# Patient Record
Sex: Male | Born: 1990 | Race: White | Hispanic: No | Marital: Single | Smoking: Current every day smoker
Health system: Southern US, Community
[De-identification: ages and names within clinical notes are randomized; demographics above are authoritative.]

## PROBLEM LIST (undated history)

## (undated) DIAGNOSIS — F32A Depression, unspecified: Secondary | ICD-10-CM

## (undated) DIAGNOSIS — F909 Attention-deficit hyperactivity disorder, unspecified type: Secondary | ICD-10-CM

## (undated) DIAGNOSIS — F429 Obsessive-compulsive disorder, unspecified: Secondary | ICD-10-CM

## (undated) DIAGNOSIS — F329 Major depressive disorder, single episode, unspecified: Secondary | ICD-10-CM

## (undated) HISTORY — DX: Attention-deficit hyperactivity disorder, unspecified type: F90.9

## (undated) HISTORY — DX: Major depressive disorder, single episode, unspecified: F32.9

## (undated) HISTORY — DX: Depression, unspecified: F32.A

## (undated) HISTORY — DX: Obsessive-compulsive disorder, unspecified: F42.9

---

## 2009-02-11 ENCOUNTER — Inpatient Hospital Stay (HOSPITAL_COMMUNITY): Admission: EM | Admit: 2009-02-11 | Discharge: 2009-02-13 | Payer: Self-pay | Admitting: Emergency Medicine

## 2009-02-11 ENCOUNTER — Emergency Department: Payer: Self-pay | Admitting: Emergency Medicine

## 2009-02-16 ENCOUNTER — Ambulatory Visit: Payer: Self-pay | Admitting: Family Medicine

## 2009-06-13 ENCOUNTER — Ambulatory Visit: Payer: Self-pay | Admitting: Family Medicine

## 2009-06-13 DIAGNOSIS — F988 Other specified behavioral and emotional disorders with onset usually occurring in childhood and adolescence: Secondary | ICD-10-CM | POA: Insufficient documentation

## 2009-06-15 ENCOUNTER — Encounter: Payer: Self-pay | Admitting: Family Medicine

## 2009-06-28 ENCOUNTER — Telehealth: Payer: Self-pay | Admitting: Family Medicine

## 2009-06-30 ENCOUNTER — Ambulatory Visit: Payer: Self-pay | Admitting: Family Medicine

## 2009-08-02 ENCOUNTER — Ambulatory Visit: Payer: Self-pay | Admitting: Family Medicine

## 2009-10-05 ENCOUNTER — Telehealth: Payer: Self-pay | Admitting: Family Medicine

## 2009-11-03 ENCOUNTER — Telehealth: Payer: Self-pay | Admitting: Family Medicine

## 2009-12-04 ENCOUNTER — Telehealth: Payer: Self-pay | Admitting: Family Medicine

## 2010-01-03 ENCOUNTER — Telehealth: Payer: Self-pay | Admitting: Family Medicine

## 2010-01-27 ENCOUNTER — Ambulatory Visit: Payer: Self-pay | Admitting: Family Medicine

## 2010-01-27 DIAGNOSIS — IMO0002 Reserved for concepts with insufficient information to code with codable children: Secondary | ICD-10-CM | POA: Insufficient documentation

## 2010-01-29 ENCOUNTER — Telehealth (INDEPENDENT_AMBULATORY_CARE_PROVIDER_SITE_OTHER): Payer: Self-pay | Admitting: *Deleted

## 2010-03-01 ENCOUNTER — Telehealth: Payer: Self-pay | Admitting: Family Medicine

## 2010-04-12 ENCOUNTER — Ambulatory Visit
Admission: RE | Admit: 2010-04-12 | Discharge: 2010-04-12 | Payer: Self-pay | Source: Home / Self Care | Attending: Family Medicine | Admitting: Family Medicine

## 2010-04-17 NOTE — Letter (Signed)
Summary: Records Dated 07-11-08 thru 11-10-08/[facility]   Records Dated 07-11-08 thru 11-10-08/[facility]   Imported By: Lanelle Bal 06/22/2009 10:32:34  _____________________________________________________________________  External Attachment:    Type:   Image     Comment:   External Document

## 2010-04-17 NOTE — Progress Notes (Signed)
Summary: ? OV  Phone Note Call from Patient   Caller: Mom Summary of Call: Mother called stating Pt needed OV w/ EKG bc you told him at last OV that he had an irregular heartbeat. I looked back through note and PE but everything is documented as normal. Please advise.  Initial call taken by: Payton Spark CMA,  June 28, 2009 11:35 AM  Follow-up for Phone Call        RTC for a nurse visit in 1 month for BP/ EKG  this was the statement at last OV. Follow-up by: Seymour Bars DO,  June 28, 2009 11:54 AM     Appended Document: ? OV Scheduled

## 2010-04-17 NOTE — Progress Notes (Signed)
Summary: Rx  Phone Note Call from Patient   Caller: Patient Summary of Call: Dr.Gaylyn Berish    Call Back [phone]  Patient need a Rx for Vyvanse 70mg  Initial call taken by: Vanessa Swazil[place],  December 04, 2009 10:07 AM    Prescriptions: VYVANSE 70 MG CAPS (LISDEXAMFETAMINE DIMESYLATE) 1 capsule by mouth q AM  #30 x 0   Entered and Authorized by:   Seymour Bars DO   Signed by:   Seymour Bars DO on 12/04/2009   Method used:   Print then Give to Patient   RxID:   [id number]

## 2010-04-17 NOTE — Miscellaneous (Signed)
Summary: old records  Clinical Lists Changes  Observations: Added new observation of PAST MED HX: ADHD hx of depression-- was on sertraline hx of OCD smoker  Pneumovax 2010 Tdap 2005 (06/15/2009 16:47) Added new observation of PRIMARY MD: Seymour Bars DO (06/15/2009 16:47)       Past History:  Past Medical History: ADHD hx of depression-- was on sertraline hx of OCD smoker  Pneumovax 2010 Tdap 2005

## 2010-04-17 NOTE — Assessment & Plan Note (Signed)
Summary: sinusitis   Vital Signs:  Patient profile:   20 year old male Height:      72.5 inches Weight:      170 pounds BMI:     22.82 O2 Sat:      97 % on Room air Temp:     98.7 degrees F oral Pulse rate:   105 / minute BP sitting:   124 / 83  (left arm) Cuff size:   large  Vitals Entered By: Payton Spark CMA (Aug 02, 2009 2:06 PM)  O2 Flow:  Room air CC: Facial pressure, nasal congestion, fever, cough and achy x 1 week.    Primary Care Provider:  Seymour Bars DO  CC:  Facial pressure, nasal congestion, fever, and cough and achy x 1 week. Marland Kitchen  History of Present Illness: 20 yo WM present for 1 wk of head congestion.  He has had a fever up 102 the last 2 days.  He feels really tired.  no sore throat.  he is coughing.  He has tried Mucinex, [employer], Nyquil, ASA.  Meds don't help much.  No hx of allergies.  Drinking well but not eating.  No N/V/D.  No appetitie.  Pressure/ HA behind L eye.  Current Medications (verified): 1)  Vyvanse 70 Mg Caps (Lisdexamfetamine Dimesylate) .Marland Kitchen.. 1 Capsule By Mouth Q Am  Allergies (verified): 1)  ! Cephalexin  Past History:  Past Medical History: Reviewed history from 06/15/2009 and no changes required. ADHD hx of depression-- was on sertraline hx of OCD smoker  Pneumovax 2010 Tdap 2005  Past Surgical History: Reviewed history from 02/16/2009 and no changes required. Denies surgical history  Social History: Reviewed history from 06/13/2009 and no changes required. Occupation:student Single Current Smoker 1ppd x 4 yrs Alcohol use-no Sexually active. In school for chemistry.  Review of Systems      See HPI  Physical Exam  General:  alert, well-developed, well-nourished, and well-hydrated.   Head:  normocephalic and atraumatic.  L frontal and maxillary sinus TTP Eyes:  eyes slightly watery, slight ptosis L eye Ears:  EACs patent; TMs translucent and gray with good cone of light and bony landmarks.  Nose:  copious nasal  congestion Mouth:  o/p injected with clear postnasal drip Neck:  no masses.   Lungs:  Normal respiratory effort, chest expands symmetrically. Lungs are clear to auscultation, no crackles or wheezes. dry cough Heart:  Normal rate and regular rhythm. S1 and S2 normal without gallop, murmur, click, rub or other extra sounds. Skin:  color normal.   Cervical Nodes:  No lymphadenopathy noted   Impression & Recommendations:  Problem # 1:  ACUTE SINUSITIS, UNSPECIFIED (ICD-461.9) Treat with 10 days of Amoxicillin + Advil cold and sinus. Call if not improtiving in1 wk.  Avoid smoking. His updated medication list for this problem includes:    Amoxicillin 875 Mg Tabs (Amoxicillin) .Marland Kitchen... 1 tab by mouth two times a day x 10 days  Complete Medication List: 1)  Vyvanse 70 Mg Caps (Lisdexamfetamine dimesylate) .Marland Kitchen.. 1 capsule by mouth q am 2)  Amoxicillin 875 Mg Tabs (Amoxicillin) .Marland Kitchen.. 1 tab by mouth two times a day x 10 days  Patient Instructions: 1)  Take Amoxicillin x 10 days. 2)  Use OTC Advil cold and sinus + Delsym for congestion + cough. 3)  Avoid smoking. 4)  Call if  not improving in 1 wk. Prescriptions: AMOXICILLIN 875 MG TABS (AMOXICILLIN) 1 tab by mouth two times a day x 10 days  #  20 x 0   Entered and Authorized by:   Seymour Bars DO   Signed by:   Seymour Bars DO on 08/02/2009   Method used:   Print then Give to Patient   RxID:   [id number] VYVANSE 70 MG CAPS (LISDEXAMFETAMINE DIMESYLATE) 1 capsule by mouth q AM  #30 x 0   Entered and Authorized by:   Seymour Bars DO   Signed by:   Seymour Bars DO on 08/02/2009   Method used:   Print then Give to Patient   RxID:   [id number]

## 2010-04-17 NOTE — Assessment & Plan Note (Signed)
Summary: BP check and EKG  Nurse Visit   Vital Signs:  Patient profile:   20 year old male Height:      72.5 inches Pulse rate:   77 / minute BP sitting:   102 / 58  (left arm) Cuff size:   regular  Vitals Entered By: Kathlene November (June 30, 2009 11:23 AM) CC: Follow-up HTN HPI: Taking Meds?not taking any Side Effects?none Chest Pain, SOB, Dizziness?none A/P: HTN(401.1) At Goal? If no, MD will be notified. Follow-up in--  5 minutes was spent with the pt.  Allergies: 1)  ! Cephalexin  Orders Added: 1)  Est. Patient Level I [56213] 2)  EKG w/ Interpretation [93000] Prescriptions: VYVANSE 70 MG CAPS (LISDEXAMFETAMINE DIMESYLATE) 1 capsule by mouth q AM  #30 x 0   Entered and Authorized by:   Seymour Bars DO   Signed by:   Seymour Bars DO on 06/30/2009   Method used:   Print then Give to Patient   RxID:   [id number]      Impression & Recommendations:  Problem # 1:  ADD (ICD-314.00)  Nurse visit BP check today -- BP looks great on Vyvanse this time. EKG NSR, normal QTC.  No arrythmia or sign of ischemia.  Normal axis. RFd Vyvanse.    Orders: EKG w/ Interpretation (93000)  Complete Medication List: 1)  Vyvanse 70 Mg Caps (Lisdexamfetamine dimesylate) .Marland Kitchen.. 1 capsule by mouth q am

## 2010-04-17 NOTE — Assessment & Plan Note (Signed)
Summary: NOV ADHD   Vital Signs:  Patient profile:   20 year old male Height:      72.5 inches Weight:      174 pounds BMI:     23.36 O2 Sat:      99 % on Room air Temp:     98.3 degrees F oral Pulse rate:   75 / minute BP sitting:   148 / 76  (left arm) Cuff size:   regular  Vitals Entered By: Payton Spark CMA (June 13, 2009 1:47 PM)  O2 Flow:  Room air CC: New to est. Discuss ADHD meds and refills.    Primary Care Provider:  Seymour Bars DO  CC:  New to est. Discuss ADHD meds and refills. .  History of Present Illness: 20 yo WM presents for NOV.  He has a hx of ADHD.  He was formerly seen a [facility] but his mom lost his RX for Adderalll and he was told that they would no longer fill his RX.  He has been on meds for 14 yrs. He was on Vyvanse 70 mg/ day and did well on this.  He changed to Adderall 20 mg two times a day but doesn't feel like this helped much.  He is in school and needs to focus on his schoolwork.  He smoked a cigarette just before coming in today.    Current Medications (verified): 1)  Adderall 20 Mg Tabs (Amphetamine-Dextroamphetamine) .... Take 1 Tab By Mouth Two Times A Day  Allergies (verified): 1)  ! Cephalexin  Past History:  Past Medical History: Reviewed history from 02/16/2009 and no changes required. ADHD  Past Surgical History: Reviewed history from 02/16/2009 and no changes required. Denies surgical history  Family History: brother high cholesterol father ETOHism, HTN, high cholesterol mother DM  Social History: Reviewed history from 02/16/2009 and no changes required. Occupation:student Single Current Smoker 1ppd x 4 yrs Alcohol use-no Sexually active. In school for chemistry.  Review of Systems       no fevers/sweats/weakness, unexplained wt loss/gain, no change in vision, no difficulty hearing, ringing in ears, no hay fever/allergies, no CP/discomfort, no palpitations, no breast lump/nipple discharge, no cough/wheeze, no  blood in stool, no N/V/D, no nocturia, no leaking urine, no unusual vag bleeding, no vaginal/penile discharge, no muscle/joint pain, no rash, no new/changing mole, no HA, no memory loss, no anxiety, no sleep problem, no depression, no unexplained lumps, no easy bruising/bleeding, no concern with sexual function   Physical Exam  General:  alert, well-developed, well-nourished, and well-hydrated.   Head:  normocephalic and atraumatic.   Nose:  no nasal discharge.   Mouth:  good dentition and pharynx pink and moist.   Neck:  no masses.   Lungs:  Normal respiratory effort, chest expands symmetrically. Lungs are clear to auscultation, no crackles or wheezes. Heart:  Normal rate and regular rhythm. S1 and S2 normal without gallop, murmur, click, rub or other extra sounds. Skin:  color normal.   Cervical Nodes:  No lymphadenopathy noted Psych:  good eye contact, not anxious appearing, and not depressed appearing.     Impression & Recommendations:  Problem # 1:  ADD (ICD-314.00) Will transition him back to Vyvanse 70 mg qAM.  RTC for a nurse visit in 1 month for BP/ EKG. Will watch his BP since it is elevated today.    Complete Medication List: 1)  Vyvanse 70 Mg Caps (Lisdexamfetamine dimesylate) .Marland Kitchen.. 1 capsule by mouth q am  Patient Instructions: 1)  Change ADHD meds to Vyvanse daily. 2)  Return for a nurse BP check/ EKG in 1 month. 3)  Schedule a PHYSICAL in June. 4)  Plan on starting PT down the hall for hx of low back fracture. 5)  You will get a call to get started.   Prescriptions: VYVANSE 70 MG CAPS (LISDEXAMFETAMINE DIMESYLATE) 1 capsule by mouth q AM  #30 x 0   Entered and Authorized by:   Seymour Bars DO   Signed by:   Seymour Bars DO on 06/13/2009   Method used:   Print then Give to Patient   RxID:   [id number]

## 2010-04-17 NOTE — Progress Notes (Signed)
Summary: Vyvance refill  Phone Note Refill Request   Refills Requested: Medication #1:  VYVANSE 70 MG CAPS 1 capsule by mouth q AM Initial call taken by: Payton Spark CMA,  October 05, 2009 1:46 PM    Prescriptions: VYVANSE 70 MG CAPS (LISDEXAMFETAMINE DIMESYLATE) 1 capsule by mouth q AM  #30 x 0   Entered and Authorized by:   Seymour Bars DO   Signed by:   Seymour Bars DO on 10/06/2009   Method used:   Print then Give to Patient   RxID:   [id number]

## 2010-04-17 NOTE — Letter (Signed)
Summary: Out of Work  [facility]  [address]   [place], [place] [address]   Phone: [phone]  Fax: [phone]    January 27, 2010   Employee:  TEE RICHESON    To Whom It May Concern:   Parish was evaluated in our clinic today, and left at 3:35PM   If you need additional information, please feel free to contact our office.         Sincerely,    Donna Christen MD
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
## 2010-04-17 NOTE — Progress Notes (Signed)
Summary: Vyvance refill  Phone Note Refill Request   Refills Requested: Medication #1:  VYVANSE 70 MG CAPS 1 capsule by mouth q AM Initial call taken by: Payton Spark CMA,  November 03, 2009 9:47 AM    Prescriptions: VYVANSE 70 MG CAPS (LISDEXAMFETAMINE DIMESYLATE) 1 capsule by mouth q AM  #30 x 0   Entered and Authorized by:   Seymour Bars DO   Signed by:   Seymour Bars DO on 11/03/2009   Method used:   Print then Give to Patient   RxID:   [id number]

## 2010-04-17 NOTE — Progress Notes (Signed)
Summary: Rx. refill request  Phone Note Refill Request Message from:  Patient on January 03, 2010 8:35 AM  Refills Requested: Medication #1:  VYVANSE 70 MG CAPS 1 capsule by mouth q AM Mom is requesting that she can come by and pick this up this morning... Wants to be called when its ready [phone]  Initial call taken by: Michaelle Copas,  January 03, 2010 8:36 AM    Prescriptions: VYVANSE 70 MG CAPS (LISDEXAMFETAMINE DIMESYLATE) 1 capsule by mouth q AM  #30 x 0   Entered and Authorized by:   Seymour Bars DO   Signed by:   Seymour Bars DO on 01/03/2010   Method used:   Print then Give to Patient   RxID:   [id number]

## 2010-04-17 NOTE — Miscellaneous (Signed)
Summary: Vaccine Records/[facility]  Vaccine Records/[facility]   Imported By: Lanelle Bal 06/22/2009 10:33:42  _____________________________________________________________________  External Attachment:    Type:   Image     Comment:   External Document

## 2010-04-17 NOTE — Progress Notes (Signed)
  Phone Note Outgoing Call Call back at [facility] Phone [phone]   Call placed by: Lajean Saver RN,  January 29, 2010 10:01 AM Call placed to: Patient Action Taken: Phone Call Completed Summary of Call: Caqllback: Spoke with patient's parent who says Forbes seems to be doing better. Message left for him to call with any questions or concerns

## 2010-04-19 NOTE — Assessment & Plan Note (Signed)
Summary: ADD f/u   Vital Signs:  Patient profile:   20 year old male Height:      72.5 inches Weight:      174 pounds BMI:     23.36 O2 Sat:      97 % on Room air Pulse rate:   84 / minute BP sitting:   130 / 76  (left arm) Cuff size:   regular  Vitals Entered By: Payton Spark CMA (April 12, 2010 10:47 AM)  O2 Flow:  Room air CC: F/U ADD. Discuss med change   Primary Care Provider:  Seymour Bars DO  CC:  F/U ADD. Discuss med change.  History of Present Illness: 20 yo WM presents for f/u ADD.  He is doing well on Vyvanse 70 mg once daily M-->F.  He feels like it is only working for about 4 hrs and then he feels like it's not working anymore.  Denies insomnia or palpitations.  It has curbed his appetite some.  He has been on 'everything' for his ADD in the past which seem to last about 6 mos then wear off.  He denies any feelings of depression or anxiety.      Current Medications (verified): 1)  Vyvanse 70 Mg Caps (Lisdexamfetamine Dimesylate) .Marland Kitchen.. 1 Capsule By Mouth Q Am  Allergies (verified): 1)  ! Cephalexin  Past History:  Past Medical History: Reviewed history from 06/15/2009 and no changes required. ADHD hx of depression-- was on sertraline hx of OCD smoker  Pneumovax 2010 Tdap 2005  Social History: Reviewed history from 06/13/2009 and no changes required. Occupation:student Single Current Smoker 1ppd x 4 yrs Alcohol use-no Sexually active. In school for chemistry.  Review of Systems      See HPI  Physical Exam  General:  alert, well-developed, well-nourished, and well-hydrated.   Neck:  no masses.   Lungs:  Normal respiratory effort, chest expands symmetrically. Lungs are clear to auscultation, no crackles or wheezes. Heart:  Normal rate and regular rhythm. S1 and S2 normal without gallop, murmur, click, rub or other extra sounds. Psych:  good eye contact, not anxious appearing, and not depressed appearing.     Impression &  Recommendations:  Problem # 1:  ADD (ICD-314.00) Feels like Vyvanse is not working anymore and has hx of ADD meds 'wearing off' after 6 mos of use.  Will change him back to Adderall short acting 20 mg two times a day which worked well for him in the past.  Call if any problems. RTC in 6 mos. BP/ HR normal.  Complete Medication List: 1)  Amphetamine-dextroamphetamine 20 Mg Tabs (Amphetamine-dextroamphetamine) .Marland Kitchen.. 1 tab by mouth qam and at 2 pm  Patient Instructions: 1)  f/u ADD in 6 mos, sooner if needed Prescriptions: AMPHETAMINE-DEXTROAMPHETAMINE 20 MG TABS (AMPHETAMINE-DEXTROAMPHETAMINE) 1 tab by mouth qAM and at 2 pm  #60 x 0   Entered and Authorized by:   Seymour Bars DO   Signed by:   Seymour Bars DO on 04/12/2010   Method used:   Print then Give to Patient   RxID:   [id number]    Orders Added: 1)  Est. Patient Level III [13086]

## 2010-04-19 NOTE — Progress Notes (Signed)
Summary: Vyvance refill  Phone Note Refill Request   Refills Requested: Medication #1:  VYVANSE 70 MG CAPS 1 capsule by mouth q AM Initial call taken by: Payton Spark CMA,  March 01, 2010 2:34 PM    Prescriptions: VYVANSE 70 MG CAPS (LISDEXAMFETAMINE DIMESYLATE) 1 capsule by mouth q AM  #30 x 0   Entered and Authorized by:   Seymour Bars DO   Signed by:   Seymour Bars DO on 03/02/2010   Method used:   Print then Give to Patient   RxID:   [id number]

## 2010-04-20 ENCOUNTER — Encounter: Payer: Self-pay | Admitting: Family Medicine

## 2010-04-20 ENCOUNTER — Ambulatory Visit (INDEPENDENT_AMBULATORY_CARE_PROVIDER_SITE_OTHER): Payer: Self-pay | Admitting: Family Medicine

## 2010-04-20 DIAGNOSIS — J1189 Influenza due to unidentified influenza virus with other manifestations: Secondary | ICD-10-CM

## 2010-04-25 NOTE — Letter (Signed)
Summary: Out of Work  [facility]  [address], [address]   [place], [place] [address]   Phone: [phone]  Fax: [phone]    April 20, 2010   Employee:  IANMICHAEL AMESCUA    To Whom It May Concern:   For Medical reasons, please excuse the above named employee from work for the following dates:  Start:   Feb 3rd- 5th  End:   Feb 6th  If you need additional information, please feel free to contact our office.         Sincerely,    Seymour Bars DO

## 2010-04-25 NOTE — Assessment & Plan Note (Signed)
Summary: flu   Vital Signs:  Patient profile:   20 year old male Height:      72.5 inches Weight:      168 pounds BMI:     22.55 O2 Sat:      98 % on Room air Temp:     98.6 degrees F oral Pulse rate:   115 / minute BP sitting:   135 / 81  (left arm) Cuff size:   large  Vitals Entered By: Payton Spark CMA (April 20, 2010 10:43 AM)  O2 Flow:  Room air CC: Congestion, cough, body aches x 5 days.   Primary Care Provider:  Seymour Bars DO  CC:  Congestion, cough, and body aches x 5 days.Marland Kitchen  History of Present Illness: 20 yo WM presents for a bad cough, subjective fevers and bodyaches with head congestion.  This started 4 days ago.  Did not get a flu shot this year.  Taking an OTC cold and flu medication which is helping some.  Has some rhinorrhea, sore throat and HA.  Denies any GI upset.  He is coughing up phlegm.  The cough is keeping him up at night.  He is a smoker.  No hx of astham.  No SOB.  Current Medications (verified): 1)  Amphetamine-Dextroamphetamine 20 Mg Tabs (Amphetamine-Dextroamphetamine) .Marland Kitchen.. 1 Tab By Mouth Qam and At 2 Pm  Allergies (verified): 1)  ! Cephalexin  Past History:  Past Medical History: Reviewed history from 06/15/2009 and no changes required. ADHD hx of depression-- was on sertraline hx of OCD smoker  Pneumovax 2010 Tdap 2005  Social History: Reviewed history from 06/13/2009 and no changes required. Occupation:student Single Current Smoker 1ppd x 4 yrs Alcohol use-no Sexually active. In school for chemistry.  Review of Systems      See HPI  Physical Exam  General:  alert, well-developed, well-nourished, and well-hydrated.   Head:  normocephalic and atraumatic.   Eyes:  no injection.   Ears:  EACs patent; TMs translucent and gray with good cone of light and bony landmarks.  Nose:  nasal congestion present Mouth:  o/p injected.  no exudates or vesicles Neck:  no masses.   Lungs:  bibasilar rhonchi, dry hacking cough.   nonlabored. no wheezing. Heart:  no murmur and tachycardia.   Skin:  skin flushed, slightly diaphoretic Cervical Nodes:  shoddy anterior cervical chain LA Psych:  good eye contact, not anxious appearing, and not depressed appearing.     Impression & Recommendations:  Problem # 1:  INFLUENZA (ICD-487.8) Clinically, Hector Campbell likely has the flu.  He is on day 5 of illness, so too late for Tamiflu.  Will treat with supportive care - rest, fluids, Ibuprofen and RX Hycodan for cough.  Written out of work x 3 days  Avoid smoking.  Call if not improved by mid next wk.  Watch for signs of post flu pneumonia given his history.  Complete Medication List: 1)  Amphetamine-dextroamphetamine 20 Mg Tabs (Amphetamine-dextroamphetamine) .Marland Kitchen.. 1 tab by mouth qam and at 2 pm 2)  Hydrocodone-homatropine 5-1.5 Mg Tabs (Hydrocodone-homatropine) .... 5 ml by mouth q 6 hrs as needed cough; caution sedation  Patient Instructions: 1)  Take OTC Advil 3 tabs every 6 hrs with OTC Sudafed for congestion with RX Hycodan for cough. 2)  Avoid smoking. 3)  REst and hydrate. 4)  Call if not improving in the next 4-5 days. Prescriptions: HYDROCODONE-HOMATROPINE 5-1.5 MG TABS (HYDROCODONE-HOMATROPINE) 5 ml by mouth q 6 hrs as needed cough;  caution sedation  #150 ml x 0   Entered and Authorized by:   Seymour Bars DO   Signed by:   Seymour Bars DO on 04/20/2010   Method used:   Printed then faxed to ...       [facility]  [place] [phone]* (retail)       [address] [place], [place]  [address]       Ph: [phone] or [phone]       Fax: [phone]   RxID:   [id number]    Orders Added: 1)  Est. Patient Level III [phone]

## 2010-05-11 ENCOUNTER — Telehealth: Payer: Self-pay | Admitting: Family Medicine

## 2010-05-15 NOTE — Progress Notes (Signed)
Summary: Refill--Generic Adderall  Phone Note Refill Request Message from:  Patient on May 11, 2010 9:11 AM  Refills Requested: Medication #1:  AMPHETAMINE-DEXTROAMPHETAMINE 20 MG TABS 1 tab by mouth qAM and at 2 pm Initial call taken by: Lucious Groves CMA,  May 11, 2010 9:11 AM  Follow-up for Phone Call        he can pick up RX here today. Follow-up by: Seymour Bars DO,  May 11, 2010 9:17 AM    Prescriptions: AMPHETAMINE-DEXTROAMPHETAMINE 20 MG TABS (AMPHETAMINE-DEXTROAMPHETAMINE) 1 tab by mouth qAM and at 2 pm  #60 x 0   Entered and Authorized by:   Seymour Bars DO   Signed by:   Seymour Bars DO on 05/11/2010   Method used:   Print then Give to Patient   RxID:   [id number]   Appended Document: Refill--Generic Adderall Left message on voicemail notifying prescription is ready for pickup.

## 2010-06-08 ENCOUNTER — Other Ambulatory Visit: Payer: Self-pay | Admitting: *Deleted

## 2010-06-08 DIAGNOSIS — F988 Other specified behavioral and emotional disorders with onset usually occurring in childhood and adolescence: Secondary | ICD-10-CM

## 2010-06-08 MED ORDER — AMPHETAMINE-DEXTROAMPHETAMINE 20 MG PO TABS: 20.0000 mg | ORAL_TABLET | Freq: Two times a day (BID) | ORAL | Status: DC

## 2010-06-20 LAB — BASIC METABOLIC PANEL
BUN: 4 mg/dL — ABNORMAL LOW (ref 6–23)
CO2: 29 mEq/L (ref 19–32)
Chloride: 104 mEq/L (ref 96–112)
Creatinine, Ser: 0.64 mg/dL (ref 0.4–1.5)
GFR calc Af Amer: >60 mL/min (ref >60)
GFR calc Af Amer: >60 mL/min (ref >60)
GFR calc non Af Amer: >60 mL/min (ref >60)
Potassium: 3.7 mEq/L (ref 3.5–5.1)
Potassium: 3.8 mEq/L (ref 3.5–5.1)

## 2010-06-20 LAB — CBC
HCT: 43.5 % (ref 39.0–52.0)
HCT: 45.2 % (ref 39.0–52.0)
MCV: 91.2 fL (ref 78.0–100.0)
Platelets: 170 10*3/uL (ref 150–400)
RBC: 4.77 MIL/uL (ref 4.22–5.81)
RBC: 4.93 MIL/uL (ref 4.22–5.81)
WBC: 10.7 10*3/uL — ABNORMAL HIGH (ref 4.0–10.5)
WBC: 13.4 10*3/uL — ABNORMAL HIGH (ref 4.0–10.5)

## 2010-07-09 ENCOUNTER — Other Ambulatory Visit: Payer: Self-pay | Admitting: *Deleted

## 2010-07-09 DIAGNOSIS — F988 Other specified behavioral and emotional disorders with onset usually occurring in childhood and adolescence: Secondary | ICD-10-CM

## 2010-07-09 MED ORDER — AMPHETAMINE-DEXTROAMPHETAMINE 20 MG PO TABS: 20.0000 mg | ORAL_TABLET | Freq: Two times a day (BID) | ORAL | Status: DC

## 2010-08-08 ENCOUNTER — Other Ambulatory Visit: Payer: Self-pay | Admitting: *Deleted

## 2010-08-08 DIAGNOSIS — F988 Other specified behavioral and emotional disorders with onset usually occurring in childhood and adolescence: Secondary | ICD-10-CM

## 2010-08-08 MED ORDER — AMPHETAMINE-DEXTROAMPHETAMINE 20 MG PO TABS: 20.0000 mg | ORAL_TABLET | Freq: Two times a day (BID) | ORAL | Status: DC

## 2010-09-03 ENCOUNTER — Other Ambulatory Visit: Payer: Self-pay | Admitting: *Deleted

## 2010-09-06 ENCOUNTER — Encounter: Payer: Self-pay | Admitting: Family Medicine

## 2010-09-07 ENCOUNTER — Ambulatory Visit (INDEPENDENT_AMBULATORY_CARE_PROVIDER_SITE_OTHER): Payer: Commercial Indemnity | Admitting: Family Medicine

## 2010-09-07 ENCOUNTER — Ambulatory Visit
Admission: RE | Admit: 2010-09-07 | Discharge: 2010-09-07 | Disposition: A | Discharge status: Home / Self Care | Payer: Commercial Indemnity | Source: Ambulatory Visit | Attending: Family Medicine | Admitting: Family Medicine

## 2010-09-07 ENCOUNTER — Telehealth: Payer: Self-pay | Admitting: Family Medicine

## 2010-09-07 ENCOUNTER — Encounter: Payer: Self-pay | Admitting: Family Medicine

## 2010-09-07 VITALS — BP 140/74 | HR 90 | Ht 72.0 in | Wt 183.0 lb

## 2010-09-07 DIAGNOSIS — F988 Other specified behavioral and emotional disorders with onset usually occurring in childhood and adolescence: Secondary | ICD-10-CM

## 2010-09-07 DIAGNOSIS — R05 Cough: Secondary | ICD-10-CM

## 2010-09-07 MED ORDER — DOXYCYCLINE HYCLATE 100 MG PO TABS: 100.0000 mg | ORAL_TABLET | Freq: Two times a day (BID) | ORAL | Status: AC

## 2010-09-07 MED ORDER — AMPHETAMINE-DEXTROAMPHETAMINE 20 MG PO TABS: 20.0000 mg | ORAL_TABLET | Freq: Two times a day (BID) | ORAL | Status: DC

## 2010-09-07 NOTE — Telephone Encounter (Signed)
Call pt: CXR shows bronchitis will send abx to pharm.

## 2010-09-07 NOTE — Telephone Encounter (Signed)
Pt aware of the above  

## 2010-09-07 NOTE — Progress Notes (Signed)
  Subjective:    Patient ID: Hector Campbell, male    DOB: 03/20/1990, 20 y.o.   MRN: [id number]  HPI Cough is mostly wet and in the AM.  A few times notices a grey tingue. Worse in the AM. No fever or SOB. Never happened before.  Mild ST.  No sinus congestion. Really thick sputum. No hx of PNA.  Has a bad taste in his mouth. No sig cough during the daytime. No worsening or alleviating symptoms. He does smoke about a pack per day.  He wants to know taking his Adderall refilled today. He is due.   Review of Systems     Objective:   Physical Exam  Constitutional: He is oriented to person, place, and time. He appears well-developed and well-nourished.  HENT:  Head: Normocephalic and atraumatic.  Right Ear: External ear normal.  Left Ear: External ear normal.  Nose: Nose normal.  Mouth/Throat: Oropharynx is clear and moist.       TMs and canals are clear.   Eyes: Conjunctivae and EOM are normal. Pupils are equal, round, and reactive to light.  Neck: Neck supple. No thyromegaly present.  Cardiovascular: Normal rate and normal heart sounds.   Pulmonary/Chest: Effort normal and breath sounds normal.       Prolonged expiration.   Lymphadenopathy:    He has no cervical adenopathy.  Neurological: He is alert and oriented to person, place, and time.  Skin: Skin is warm and dry.  Psychiatric: He has a normal mood and affect.          Assessment & Plan:  AM cough with grey sputum production - Discussed need for smoking cessation. Also will get a chest x-ray today as he does have some prolonged expiration on exam. He has been afebrile which is reassuring. We'll call with the results later today. Next  Blood pressure is a little elevated today. I did ask him to followup in the next month with Dr. Cathey Endow to have this rechecked. While the back of his vital signs he's had a few blood pressures have been elevated. Some of this may be secondary to his smoking. I did go ahead and refill his Adderall  but if his blood pressure is not well controlled at his followup in 1 month then we may have to hold the Adderall  Note: His chest x-ray showed bronchitis, possibly chronic bronchitis. When he follows up for his blood pressure we will need to discuss with him that he might need spirometry for further evaluation of his lungs.

## 2010-10-08 ENCOUNTER — Other Ambulatory Visit: Payer: Self-pay | Admitting: Family Medicine

## 2010-10-08 DIAGNOSIS — F988 Other specified behavioral and emotional disorders with onset usually occurring in childhood and adolescence: Secondary | ICD-10-CM

## 2010-10-08 MED ORDER — AMPHETAMINE-DEXTROAMPHETAMINE 20 MG PO TABS: 20.0000 mg | ORAL_TABLET | Freq: Two times a day (BID) | ORAL | Status: DC

## 2010-10-08 NOTE — Telephone Encounter (Signed)
Closed

## 2010-11-05 ENCOUNTER — Other Ambulatory Visit: Payer: Self-pay | Admitting: *Deleted

## 2010-11-05 DIAGNOSIS — F988 Other specified behavioral and emotional disorders with onset usually occurring in childhood and adolescence: Secondary | ICD-10-CM

## 2010-11-07 ENCOUNTER — Other Ambulatory Visit: Payer: Self-pay | Admitting: *Deleted

## 2010-11-07 DIAGNOSIS — F988 Other specified behavioral and emotional disorders with onset usually occurring in childhood and adolescence: Secondary | ICD-10-CM

## 2010-11-07 MED ORDER — AMPHETAMINE-DEXTROAMPHETAMINE 20 MG PO TABS: 20.0000 mg | ORAL_TABLET | Freq: Two times a day (BID) | ORAL | Status: DC

## 2010-12-07 ENCOUNTER — Other Ambulatory Visit: Payer: Self-pay | Admitting: Family Medicine

## 2010-12-07 ENCOUNTER — Ambulatory Visit (INDEPENDENT_AMBULATORY_CARE_PROVIDER_SITE_OTHER): Payer: Commercial Indemnity | Admitting: Family Medicine

## 2010-12-07 VITALS — BP 132/70 | HR 76

## 2010-12-07 DIAGNOSIS — Z23 Encounter for immunization: Secondary | ICD-10-CM

## 2010-12-07 DIAGNOSIS — F988 Other specified behavioral and emotional disorders with onset usually occurring in childhood and adolescence: Secondary | ICD-10-CM

## 2010-12-07 MED ORDER — AMPHETAMINE-DEXTROAMPHETAMINE 20 MG PO TABS: 20.0000 mg | ORAL_TABLET | Freq: Two times a day (BID) | ORAL | Status: DC

## 2010-12-07 NOTE — Progress Notes (Signed)
  Subjective:    Patient ID: Hector Campbell, male    DOB: March 02, 1991, 20 y.o.   MRN: [id number]  HPI BP check and flu shot for adderall refill.  Pt denies chest pain, SOB, dizziness, or heart palpitations.  Taking meds as directed w/o problems.  Denies medication side effects.  5 min spent with pt.     Review of Systems     Objective:   Physical Exam        Assessment & Plan:  Elevated BP - BP looks great today. Keep regular f/u. Wanted to make sure adderall wasn't increasing his BP.

## 2010-12-07 NOTE — Telephone Encounter (Signed)
Pt called requesting refill for his adderall. Plan:  Pt was last seen 09-07-10.  Pt was instructed to follow up in 1 mth to recheck the BP, and the appt was not satisfied.  Before a script will be written for the adderall pt has to make at minium a nurse visit to recheck this BP. Jarvis Newcomer, LPN [facility]

## 2010-12-07 NOTE — Telephone Encounter (Signed)
Pt's wife called back to let triage nurse know that the pt (her husband) had made a nurse visit today for the follow up of his BP.  When checking another staff member had already filled the adderall. Hector Newcomer, LPN [facility]

## 2011-01-07 ENCOUNTER — Other Ambulatory Visit: Payer: Self-pay | Admitting: Family Medicine

## 2011-01-07 DIAGNOSIS — F988 Other specified behavioral and emotional disorders with onset usually occurring in childhood and adolescence: Secondary | ICD-10-CM

## 2011-01-07 MED ORDER — AMPHETAMINE-DEXTROAMPHETAMINE 20 MG PO TABS: 20.0000 mg | ORAL_TABLET | Freq: Two times a day (BID) | ORAL | Status: DC

## 2011-01-07 NOTE — Telephone Encounter (Signed)
Pt's mother called and pt needs refill of his adderall. Plan:  #60/0 refills printed and pt notified to pup. Jarvis Newcomer, LPN [facility]

## 2011-01-28 ENCOUNTER — Telehealth: Payer: Self-pay | Admitting: *Deleted

## 2011-01-28 DIAGNOSIS — Z720 Tobacco use: Secondary | ICD-10-CM

## 2011-01-28 MED ORDER — VARENICLINE TARTRATE 0.5 MG X 11 & 1 MG X 42 PO MISC: ORAL | Status: DC

## 2011-01-28 NOTE — Telephone Encounter (Signed)
Pt would like to quit smoking and requests Rx for chantix be sent to [facility].

## 2011-01-28 NOTE — Telephone Encounter (Signed)
rx sent. F/U in 1 mo.

## 2011-01-29 ENCOUNTER — Telehealth: Payer: Self-pay | Admitting: *Deleted

## 2011-01-29 NOTE — Telephone Encounter (Signed)
[facility] calls and states the CHantix starter pak that you sent over with your directions is different than the pack directions and wants to know if they can use the pack directions. Those directions are start with 1/2mg  the first week then increase to 1mg  after. Your directions were increase every 3 days. Please advise

## 2011-01-29 NOTE — Telephone Encounter (Signed)
OK to use the pack directions.

## 2011-01-29 NOTE — Telephone Encounter (Signed)
Pharmacy notified.

## 2011-02-01 ENCOUNTER — Telehealth: Payer: Self-pay | Admitting: *Deleted

## 2011-02-01 DIAGNOSIS — F39 Unspecified mood [affective] disorder: Secondary | ICD-10-CM

## 2011-02-01 NOTE — Telephone Encounter (Signed)
Will put in referral. We may need to get him setup summer outside of the system to get him in more quickly. Also if she feels he may be a threat to himself and she needs to take him to the emergency department.

## 2011-02-01 NOTE — Telephone Encounter (Signed)
Mother called stating Pt really needs to see psych ASAP. She feels Pt has mood, anxiety, and behavior issues.

## 2011-02-04 ENCOUNTER — Other Ambulatory Visit: Payer: Self-pay | Admitting: *Deleted

## 2011-02-04 DIAGNOSIS — F988 Other specified behavioral and emotional disorders with onset usually occurring in childhood and adolescence: Secondary | ICD-10-CM

## 2011-02-04 MED ORDER — AMPHETAMINE-DEXTROAMPHETAMINE 20 MG PO TABS: 20.0000 mg | ORAL_TABLET | Freq: Two times a day (BID) | ORAL | Status: DC

## 2011-02-11 ENCOUNTER — Other Ambulatory Visit: Payer: Self-pay | Admitting: Physical Medicine and Rehabilitation

## 2011-02-11 DIAGNOSIS — M545 Low back pain: Secondary | ICD-10-CM

## 2011-02-21 ENCOUNTER — Encounter: Payer: Self-pay | Admitting: Family Medicine

## 2011-02-21 ENCOUNTER — Ambulatory Visit (INDEPENDENT_AMBULATORY_CARE_PROVIDER_SITE_OTHER): Payer: Commercial Indemnity | Admitting: Family Medicine

## 2011-02-21 VITALS — BP 143/72 | HR 80 | Ht 72.0 in | Wt 183.0 lb

## 2011-02-21 DIAGNOSIS — Z716 Tobacco abuse counseling: Secondary | ICD-10-CM

## 2011-02-21 DIAGNOSIS — F909 Attention-deficit hyperactivity disorder, unspecified type: Secondary | ICD-10-CM

## 2011-02-21 DIAGNOSIS — Z7189 Other specified counseling: Secondary | ICD-10-CM

## 2011-02-21 DIAGNOSIS — Z23 Encounter for immunization: Secondary | ICD-10-CM

## 2011-02-21 DIAGNOSIS — G90523 Complex regional pain syndrome I of lower limb, bilateral: Secondary | ICD-10-CM

## 2011-02-21 DIAGNOSIS — G90529 Complex regional pain syndrome I of unspecified lower limb: Secondary | ICD-10-CM

## 2011-02-21 DIAGNOSIS — G8929 Other chronic pain: Secondary | ICD-10-CM

## 2011-02-21 DIAGNOSIS — M549 Dorsalgia, unspecified: Secondary | ICD-10-CM

## 2011-02-21 MED ORDER — TETANUS-DIPHTH-ACELL PERTUSSIS 5-2.5-18.5 LF-MCG/0.5 IM SUSP: 0.5000 mL | Freq: Once | INTRAMUSCULAR | Status: DC

## 2011-02-21 MED ORDER — LISDEXAMFETAMINE DIMESYLATE 70 MG PO CAPS: 70.0000 mg | ORAL_CAPSULE | ORAL | Status: DC

## 2011-02-21 NOTE — Patient Instructions (Signed)
Will  change to vyvanse 1 po a Attention Deficit Hyperactivity Disorder Attention deficit hyperactivity disorder (ADHD) is a problem with behavior issues based on the way the brain functions (neurobehavioral disorder). It is a common reason for behavior and academic problems in school. CAUSES  The cause of ADHD is unknown in most cases. It may run in families. It sometimes can be associated with learning disabilities and other behavioral problems. SYMPTOMS  There are 3 types of ADHD. The 3 types and some of the symptoms include:  Inattentive   Gets bored or distracted easily.   Loses or forgets things. Forgets to hand in homework.   Has trouble organizing or completing tasks.   Difficulty staying on task.   An inability to organize daily tasks and school work.   Leaving projects, chores, or homework unfinished.   Trouble paying attention or responding to details. Careless mistakes.   Difficulty following directions. Often seems like is not listening.   Dislikes activities that require sustained attention (like chores or homework).   Hyperactive-impulsive   Feels like it is impossible to sit still or stay in a seat. Fidgeting with hands and feet.   Trouble waiting turn.   Talking too much or out of turn. Interruptive.   Speaks or acts impulsively.   Aggressive, disruptive behavior.   Constantly busy or on the go, noisy.   Combined   Has symptoms of both of the above.  Often children with ADHD feel discouraged about themselves and with school. They often perform well below their abilities in school. These symptoms can cause problems in home, school, and in relationships with peers. As children get older, the excess motor activities can calm down, but the problems with paying attention and staying organized persist. Most children do not outgrow ADHD but with good treatment can learn to cope with the symptoms. DIAGNOSIS  When ADHD is suspected, the diagnosis should be made  by professionals trained in ADHD.  Diagnosis will include:  Ruling out other reasons for the child's behavior.   The caregivers will check with the child's school and check their medical records.   They will talk to teachers and parents.   Behavior rating scales for the child will be filled out by those dealing with the child on a daily basis.  A diagnosis is made only after all information has been considered. TREATMENT  Treatment usually includes behavioral treatment often along with medicines. It may include stimulant medicines. The stimulant medicines decrease impulsivity and hyperactivity and increase attention. Other medicines used include antidepressants and certain blood pressure medicines. Most experts agree that treatment for ADHD should address all aspects of the child's functioning. Treatment should not be limited to the use of medicines alone. Treatment should include structured classroom management. The parents must receive education to address rewarding good behavior, discipline, and limit-setting. Tutoring or behavioral therapy or both should be available for the child. If untreated, the disorder can have long-term serious effects into adolescence and adulthood. HOME CARE INSTRUCTIONS   Often with ADHD there is a lot of frustration among the family in dealing with the illness. There is often blame and anger that is not warranted. This is a life long illness. There is no way to prevent ADHD. In many cases, because the problem affects the family as a whole, the entire family may need help. A therapist can help the family find better ways to handle the disruptive behaviors and promote change. If the child is young, most of the  therapist's work is with the parents. Parents will learn techniques for coping with and improving their child's behavior. Sometimes only the child with the ADHD needs counseling. Your caregivers can help you make these decisions.   Children with ADHD may need  help in organizing. Some helpful tips include:   Keep routines the same every day from wake-up time to bedtime. Schedule everything. This includes homework and playtime. This should include outdoor and indoor recreation. Keep the schedule on the refrigerator or a bulletin board where it is frequently seen. Mark schedule changes as far in advance as possible.   Have a place for everything and keep everything in its place. This includes clothing, backpacks, and school supplies.   Encourage writing down assignments and bringing home needed books.   Offer your child a well-balanced diet. Breakfast is especially important for school performance. Children should avoid drinks with caffeine including:   Soft drinks.   Coffee.   Tea.   However, some older children (adolescents) may find these drinks helpful in improving their attention.   Children with ADHD need consistent rules that they can understand and follow. If rules are followed, give small rewards. Children with ADHD often receive, and expect, criticism. Look for good behavior and praise it. Set realistic goals. Give clear instructions. Look for activities that can foster success and self-esteem. Make time for pleasant activities with your child. Give lots of affection.   Parents are their children's greatest advocates. Learn as much as possible about ADHD. This helps you become a stronger and better advocate for your child. It also helps you educate your child's teachers and instructors if they feel inadequate in these areas. Parent support groups are often helpful. A national group with local chapters is called CHADD (Children and Adults with Attention Deficit Hyperactivity Disorder).  PROGNOSIS  There is no cure for ADHD. Children with the disorder seldom outgrow it. Many find adaptive ways to accommodate the ADHD as they mature. SEEK MEDICAL CARE IF:  Your child has repeated muscle twitches, cough or speech outbursts.   Your child has  sleep problems.   Your child has a marked loss of appetite.   Your child develops depression.   Your child has new or worsening behavioral problems.   Your child develops dizziness.   Your child has a racing heart.   Your child has stomach pains.   Your child develops headaches.  Document Released: 02/22/2002 Document Revised: 11/14/2010 Document Reviewed: 10/05/2007 [facility] Patient Information 2012 [place], [place].

## 2011-02-23 ENCOUNTER — Other Ambulatory Visit: Payer: Commercial Indemnity

## 2011-02-24 ENCOUNTER — Encounter: Payer: Self-pay | Admitting: Family Medicine

## 2011-02-24 NOTE — Progress Notes (Signed)
  Subjective:    Patient ID: Hector Campbell, male    DOB: 1990-10-03, 20 y.o.   MRN: [id number]  HPI  #1Patient on crutches due to some chronic back pain . Unable to get weight today due to lower exrtremity weakness being followed by ortho and neuro #2 immunization update # 3ADHD he feels he needs his ADHD medication changed due to tolerence #4 tobacco abuse Review of Systems  Musculoskeletal: Positive for back pain and gait problem.  Neurological: Positive for weakness.       Reduce ability to ambulate w/o crutches       Objective:   Physical Exam  Constitutional: He is oriented to person, place, and time. He appears well-developed and well-nourished.  HENT:  Head: Normocephalic.  Neck: Normal range of motion. Neck supple.  Cardiovascular: Normal rate, regular rhythm and normal heart sounds.   Pulmonary/Chest: Effort normal and breath sounds normal.  Musculoskeletal:       On crutches  Neurological: He is alert and oriented to person, place, and time.  Skin: Skin is warm and dry.  Psychiatric: He has a normal mood and affect. His behavior is normal.          Assessment & Plan:  #1Lower extremity weakness followed by ortho and neuro #2 TDaP given #3switch to vyvance #4 counciled about smoking

## 2011-03-07 ENCOUNTER — Other Ambulatory Visit: Payer: Self-pay | Admitting: *Deleted

## 2011-03-07 ENCOUNTER — Ambulatory Visit: Payer: Commercial Indemnity | Admitting: Family Medicine

## 2011-03-07 DIAGNOSIS — F909 Attention-deficit hyperactivity disorder, unspecified type: Secondary | ICD-10-CM

## 2011-03-07 MED ORDER — LISDEXAMFETAMINE DIMESYLATE 70 MG PO CAPS: 70.0000 mg | ORAL_CAPSULE | ORAL | Status: DC

## 2011-03-07 NOTE — Telephone Encounter (Signed)
Done  EW

## 2011-03-07 NOTE — Telephone Encounter (Signed)
Pt would like a refill on the Vyvanse, Knows its early but would like to pick up due to holiday closings and travel

## 2011-03-14 ENCOUNTER — Telehealth: Payer: Self-pay | Admitting: Family Medicine

## 2011-03-14 MED ORDER — VARENICLINE TARTRATE 1 MG PO TABS: 1.0000 mg | ORAL_TABLET | Freq: Two times a day (BID) | ORAL | Status: AC

## 2011-03-14 NOTE — Telephone Encounter (Signed)
This has already been done.

## 2011-03-14 NOTE — Telephone Encounter (Signed)
Patient's mother called and request to know if he can get a script for Chantex to help stop smoking...Marland KitchenMarland Kitchen

## 2011-03-14 NOTE — Telephone Encounter (Signed)
Call pt: [facility] continuing pack.

## 2011-03-15 ENCOUNTER — Telehealth: Payer: Self-pay | Admitting: *Deleted

## 2011-03-15 DIAGNOSIS — F909 Attention-deficit hyperactivity disorder, unspecified type: Secondary | ICD-10-CM

## 2011-03-15 MED ORDER — LISDEXAMFETAMINE DIMESYLATE 70 MG PO CAPS: 70.0000 mg | ORAL_CAPSULE | ORAL | Status: DC

## 2011-03-15 NOTE — Telephone Encounter (Signed)
That is fine.  Printed rx. Can pick up.

## 2011-03-15 NOTE — Telephone Encounter (Signed)
Pt's mom would like to get the prescription refilled before the end of the year due to money situation. She says [facility] told her it could be refilled today. Please advise.  MA, LPN

## 2011-03-15 NOTE — Telephone Encounter (Signed)
Informed to come and pick up prescription.  MA, LPN

## 2011-03-15 NOTE — Telephone Encounter (Signed)
Okay to refill next Wednesday.

## 2011-03-15 NOTE — Telephone Encounter (Signed)
Mom states she has lost prescription for Vyvanse. Would like to know if she can get another.  MA, LPN

## 2011-04-16 ENCOUNTER — Telehealth: Payer: Self-pay | Admitting: *Deleted

## 2011-04-16 MED ORDER — LISDEXAMFETAMINE DIMESYLATE 70 MG PO CAPS: 70.0000 mg | ORAL_CAPSULE | ORAL | Status: DC

## 2011-04-16 NOTE — Telephone Encounter (Signed)
Done should be up front.

## 2011-04-16 NOTE — Telephone Encounter (Signed)
Pt called and would like a refill on the Vyvanse. Do not see on med list but there is a note saying he is on it. Need directions and dose put in and I can print and you sign. Will pick up tomorrow

## 2011-04-17 ENCOUNTER — Other Ambulatory Visit: Payer: Self-pay | Admitting: *Deleted

## 2011-04-17 MED ORDER — LISDEXAMFETAMINE DIMESYLATE 70 MG PO CAPS: 70.0000 mg | ORAL_CAPSULE | ORAL | Status: DC

## 2011-05-20 ENCOUNTER — Other Ambulatory Visit: Payer: Self-pay | Admitting: *Deleted

## 2011-05-20 MED ORDER — LISDEXAMFETAMINE DIMESYLATE 70 MG PO CAPS: 70.0000 mg | ORAL_CAPSULE | ORAL | Status: DC

## 2011-06-19 ENCOUNTER — Other Ambulatory Visit: Payer: Self-pay | Admitting: *Deleted

## 2011-06-19 MED ORDER — LISDEXAMFETAMINE DIMESYLATE 70 MG PO CAPS: 70.0000 mg | ORAL_CAPSULE | ORAL | Status: DC

## 2011-07-02 ENCOUNTER — Encounter: Payer: Self-pay | Admitting: Family Medicine

## 2011-07-02 ENCOUNTER — Ambulatory Visit (INDEPENDENT_AMBULATORY_CARE_PROVIDER_SITE_OTHER): Payer: Self-pay | Admitting: Family Medicine

## 2011-07-02 VITALS — BP 121/77 | HR 77 | Ht 72.0 in | Wt 188.0 lb

## 2011-07-02 DIAGNOSIS — M549 Dorsalgia, unspecified: Secondary | ICD-10-CM

## 2011-07-02 DIAGNOSIS — F909 Attention-deficit hyperactivity disorder, unspecified type: Secondary | ICD-10-CM

## 2011-07-02 DIAGNOSIS — B356 Tinea cruris: Secondary | ICD-10-CM

## 2011-07-02 DIAGNOSIS — G8929 Other chronic pain: Secondary | ICD-10-CM

## 2011-07-02 MED ORDER — KETOCONAZOLE 2 % EX CREA: TOPICAL_CREAM | Freq: Every day | CUTANEOUS | Status: DC

## 2011-07-02 MED ORDER — HYDROCODONE-ACETAMINOPHEN 10-325 MG PO TABS: 1.0000 | ORAL_TABLET | Freq: Two times a day (BID) | ORAL | Status: DC
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
## 2011-07-02 NOTE — Patient Instructions (Signed)
Jock Itch Jock itch is a fungal infection of the skin in the groin area. It is sometimes called "ringworm" even though it is not caused by a worm. A fungus is a type of germ that thrives in dark, damp places.  CAUSES  This infection may spread from:  A fungus infection elsewhere on the body (such as athlete's foot).   Sharing towels or clothing.  This infection is more common in:  Hot, humid climates.   People who wear tight-fitting clothing or wet bathing suits for long periods of time.   Athletes.   Overweight people.   People with diabetes.  SYMPTOMS  Jock itch causes the following symptoms:  Red, pink or brown rash in the groin. Rash may spread to the thighs, anus, and buttocks.   Itching.  DIAGNOSIS  Your caregiver may make the diagnosis by looking at the rash. Sometimes a skin scraping will be sent to test for fungus. Testing can be done either by looking under the microscope or by doing a culture (test to try to grow the fungus). A culture can take up to 2 weeks to come back. TREATMENT  Jock itch may be treated with:  Skin cream or ointment to kill fungus.   Medicine by mouth to kill fungus.   Skin cream or ointment to calm the itching.   Compresses or medicated powders to dry the infected skin.  HOME CARE INSTRUCTIONS   Be sure to treat the rash completely. Follow your caregiver's instructions. It can take a couple of weeks to treat. If you do not treat the infection long enough, the rash can come back.   Wear loose-fitting clothing.   Men should wear cotton boxer shorts.   Women should wear cotton underwear.   Avoid hot baths.   Dry the groin area well after bathing.  SEEK MEDICAL CARE IF:   Your rash is worse.   Your rash is spreading.   Your rash returns after treatment is finished.   Your rash is not gone in 4 weeks. Fungal infections are slow to respond to treatment. Some redness may remain for several weeks after the fungus is gone.  SEEK  IMMEDIATE MEDICAL CARE IF:  The area becomes red, warm, tender, and swollen.   You have a fever.  Document Released: 02/22/2002 Document Revised: 02/21/2011 Document Reviewed: 01/22/2008 [employer] Patient Information 2012 [employer]. 

## 2011-07-18 ENCOUNTER — Other Ambulatory Visit: Payer: Self-pay | Admitting: *Deleted

## 2011-07-18 MED ORDER — HYDROCODONE-ACETAMINOPHEN 10-325 MG PO TABS: 1.0000 | ORAL_TABLET | Freq: Two times a day (BID) | ORAL | Status: DC

## 2011-07-18 MED ORDER — LISDEXAMFETAMINE DIMESYLATE 70 MG PO CAPS: 70.0000 mg | ORAL_CAPSULE | ORAL | Status: DC

## 2011-07-18 NOTE — Telephone Encounter (Signed)
Med refilled.

## 2011-07-18 NOTE — Telephone Encounter (Signed)
You may refill for 62 this month and then monthly either 60,62, or 56 or February.

## 2011-07-18 NOTE — Telephone Encounter (Signed)
Pt is requesting refill on Hydrocodone. Please advise if I can refill.

## 2011-07-22 ENCOUNTER — Other Ambulatory Visit: Payer: Self-pay | Admitting: *Deleted

## 2011-07-22 NOTE — Telephone Encounter (Signed)
Pt calls and would like to get a refill on his Hydrocodone pain med. States was seeing Dr. Ethelene Hal with [facility] for pain management but Dr. Ethelene Hal no longer does pain management and said discussed with you and agreed to follow him.Would like to pick up Tuesday if possible

## 2011-07-23 MED ORDER — HYDROCODONE-ACETAMINOPHEN 10-325 MG PO TABS: 1.0000 | ORAL_TABLET | Freq: Two times a day (BID) | ORAL | Status: DC

## 2011-07-23 NOTE — Telephone Encounter (Signed)
Addended by: Darla Lesches A on: 07/23/2011 01:48 PM   Modules accepted: Orders

## 2011-08-20 ENCOUNTER — Other Ambulatory Visit: Payer: Self-pay | Admitting: *Deleted

## 2011-08-20 MED ORDER — LISDEXAMFETAMINE DIMESYLATE 70 MG PO CAPS: 70.0000 mg | ORAL_CAPSULE | ORAL | Status: DC

## 2011-08-20 MED ORDER — HYDROCODONE-ACETAMINOPHEN 10-325 MG PO TABS: 1.0000 | ORAL_TABLET | Freq: Two times a day (BID) | ORAL | Status: DC

## 2011-08-20 NOTE — Telephone Encounter (Signed)
Pt is requesting refill on Hydrocodone. Please advise if we can refill.

## 2011-08-20 NOTE — Telephone Encounter (Signed)
LMOM that pt could get rx from pharmacy on Friday 08/23/11.

## 2011-09-18 ENCOUNTER — Other Ambulatory Visit: Payer: Self-pay | Admitting: *Deleted

## 2011-09-18 MED ORDER — LISDEXAMFETAMINE DIMESYLATE 70 MG PO CAPS: 70.0000 mg | ORAL_CAPSULE | Freq: Every day | ORAL | Status: DC

## 2011-09-18 MED ORDER — LISDEXAMFETAMINE DIMESYLATE 70 MG PO CAPS: 70.0000 mg | ORAL_CAPSULE | ORAL | Status: DC

## 2011-09-20 ENCOUNTER — Other Ambulatory Visit: Payer: Self-pay | Admitting: Family Medicine

## 2011-10-17 ENCOUNTER — Encounter: Payer: Self-pay | Admitting: Family Medicine

## 2011-10-17 ENCOUNTER — Ambulatory Visit (INDEPENDENT_AMBULATORY_CARE_PROVIDER_SITE_OTHER): Payer: PRIVATE HEALTH INSURANCE | Admitting: Family Medicine

## 2011-10-17 VITALS — BP 135/78 | HR 78 | Ht 72.0 in | Wt 192.0 lb

## 2011-10-17 DIAGNOSIS — M549 Dorsalgia, unspecified: Secondary | ICD-10-CM

## 2011-10-17 DIAGNOSIS — G8929 Other chronic pain: Secondary | ICD-10-CM

## 2011-10-17 DIAGNOSIS — M542 Cervicalgia: Secondary | ICD-10-CM

## 2011-10-17 DIAGNOSIS — F988 Other specified behavioral and emotional disorders with onset usually occurring in childhood and adolescence: Secondary | ICD-10-CM | POA: Insufficient documentation

## 2011-10-17 MED ORDER — AMPHETAMINE-DEXTROAMPHETAMINE 20 MG PO TABS: 20.0000 mg | ORAL_TABLET | Freq: Two times a day (BID) | ORAL | Status: DC

## 2011-10-17 MED ORDER — HYDROCODONE-ACETAMINOPHEN 10-325 MG PO TABS: 1.0000 | ORAL_TABLET | Freq: Two times a day (BID) | ORAL | Status: DC

## 2011-10-17 NOTE — Progress Notes (Signed)
  Subjective:    Patient ID: Hector Campbell, male    DOB: Sep 19, 1990, 21 y.o.   MRN: [id number]  HPI #1 ADD. Patient has a history of ADD he states that the Vyvanse does not seem to work as well as it did before. But he does report a history of developing a tolerance to the ADD medication stimulants and wants to go back on his Adderall for while. He states that when he does this it worked out well for him.  #2 chronic neck pain. Chronic pain he requests a refill of his Norco 10/325. He states he is still doing his physical therapy exercise in hopes that he might eventually come off his pain medication.    Review of Systems  Skin:       Pruritus. He reports family friend who staying with them recently had scabies infection.  All other systems reviewed and are negative.      BP 135/78  Pulse 78  Ht 6' (1.829 m)  Wt 192 lb (87.091 kg)  BMI 26.04 kg/m2  SpO2 99% Objective:   Physical Exam  Vitals reviewed. Constitutional: He appears well-developed.  HENT:  Head: Normocephalic.  Neck: Normal range of motion. Neck supple.  Cardiovascular: Normal rate, regular rhythm and normal heart sounds.   Pulmonary/Chest: Effort normal and breath sounds normal.  Musculoskeletal: Normal range of motion.  Neurological: He is alert.  Skin: Skin is warm and dry.  Psychiatric: He has a normal mood and affect.      Assessment & Plan:  #1   #1 renew Adderall for his ADD . #2 chronic neck and back pain we current 10/325 one tablet twice a day.  For pruritus from exposure to scabies would recommend Claritin or Allegra D. daily for about one to 2 weeks notify if rash occurs.  Return in 3 months for followup.

## 2011-10-17 NOTE — Patient Instructions (Signed)
Smoking Cessation, Tips for Success     YOU CAN QUIT SMOKING   If you are ready to quit smoking, congratulations! You have chosen to help yourself be healthier. Cigarettes bring nicotine, tar, carbon monoxide, and other irritants into your body. Your lungs, heart, and blood vessels will be able to work better without these poisons. There are many different ways to quit smoking. Nicotine gum, nicotine patches, a nicotine inhaler, or nicotine nasal spray can help with physical craving. Hypnosis, support groups, and medicines help break the habit of smoking. Here are some tips to help you quit for good.     . Throw away all cigarettes.   . Clean and remove all ashtrays from your home, work, and car.   . On a card, write down your reasons for quitting. Carry the card with you and read it when you get the urge to smoke.   . Cleanse your body of nicotine. Drink enough water and fluids to keep your urine clear or pale yellow. Do this after quitting to flush the nicotine from your body.   . Learn to predict your moods. Do not let a bad situation be your excuse to have a cigarette. Some situations in your life might tempt you into wanting a cigarette.   . Never have "just one" cigarette. It leads to wanting another and another. Remind yourself of your decision to quit.   . Change habits associated with smoking. If you smoked while driving or when feeling stressed, try other activities to replace smoking. Stand up when drinking your coffee. Brush your teeth after eating. Sit in a different chair when you read the paper. Avoid alcohol while trying to quit, and try to drink fewer caffeinated beverages. Alcohol and caffeine may urge you to smoke.   . Avoid foods and drinks that can trigger a desire to smoke, such as sugary or spicy foods and alcohol.   . Ask people who smoke not to smoke around you.   . Have something planned to do right after eating or having a cup of coffee. Take a walk or exercise to perk you up. This will  help to keep you from overeating.   . Try a relaxation exercise to calm you down and decrease your stress. Remember, you may be tense and nervous for the first 2 weeks after you quit, but this will pass.   . Find new activities to keep your hands busy. Play with a pen, coin, or rubber band. Doodle or draw things on paper.   . Brush your teeth right after eating. This will help cut down on the craving for the taste of tobacco after meals. You can try mouthwash, too.   . Use oral substitutes, such as lemon drops, carrots, a cinnamon stick, or chewing gum, in place of cigarettes. Keep them handy so they are available when you have the urge to smoke.   . When you have the urge to smoke, try deep breathing.   . Designate your home as a nonsmoking area.   . If you are a heavy smoker, ask your caregiver about a prescription for nicotine chewing gum. It can ease your withdrawal from nicotine.   . Reward yourself. Set aside the cigarette money you save and buy yourself something nice.   . Look for support from others. Join a support group or smoking cessation program. Ask someone at home or at work to help you with your plan to quit smoking.   . Always ask   yourself, "Do I need this cigarette or is this just a reflex?" Tell yourself, "Today, I choose not to smoke," or "I do not want to smoke." You are reminding yourself of your decision to quit, even if you do smoke a cigarette.    HOW WILL I FEEL WHEN I QUIT SMOKING?     . The benefits of not smoking start within days of quitting.   . You may have symptoms of withdrawal because your body is used to nicotine (the addictive substance in cigarettes). You may crave cigarettes, be irritable, feel very hungry, cough often, get headaches, or have difficulty concentrating.   . The withdrawal symptoms are only temporary. They are strongest when you first quit but will go away within 10 to 14 days.   . When withdrawal symptoms occur, stay in control. Think about your reasons for  quitting. Remind yourself that these are signs that your body is healing and getting used to being without cigarettes.   . Remember that withdrawal symptoms are easier to treat than the major diseases that smoking can cause.   . Even after the withdrawal is over, expect periodic urges to smoke. However, these cravings are generally short-lived and will go away whether you smoke or not. Do not smoke!   . If you relapse and smoke again, do not lose hope. Most smokers quit 3 times before they are successful.   . If you relapse, do not give up! Plan ahead and think about what you will do the next time you get the urge to smoke.    LIFE AS A NONSMOKER: MAKE IT FOR A MONTH, MAKE IT FOR LIFE     Day 1: Hang this page where you will see it every day.   Day 2: Get rid of all ashtrays, matches, and lighters.   Day 3: Drink water. Breathe deeply between sips.   Day 4: Avoid places with smoke-filled air, such as bars, clubs, or the smoking section of restaurants.   Day 5: Keep track of how much money you save by not smoking.   Day 6: Avoid boredom. Keep a good book with you or go to the movies.   Day 7: Reward yourself! One week without smoking!   Day 8: Make a dental appointment to get your teeth cleaned.   Day 9: Decide how you will turn down a cigarette before it is offered to you.   Day 10: Review your reasons for quitting.   Day 11: Distract yourself. Stay active to keep your mind off smoking and to relieve tension. Take a walk, exercise, read a book, do a crossword puzzle, or try a new hobby.   Day 12: Exercise. Get off the bus before your stop or use stairs instead of escalators.   Day 13: Call on friends for support and encouragement.   Day 14: Reward yourself! Two weeks without smoking!   Day 15: Practice deep breathing exercises.   Day 16: Bet a friend that you can stay a nonsmoker.   Day 17: Ask to sit in nonsmoking sections of restaurants.   Day 18: Hang up "No Smoking" signs.   Day 19: Think of yourself as a  nonsmoker.   Day 20: Each morning, tell yourself you will not smoke.   Day 21: Reward yourself! Three weeks without smoking!   Day 22: Think of smoking in negative ways. Remember how it stains your teeth, gives you bad breath, and leaves you short of breath.   Day   23: Eat a nutritious breakfast.   Day 24:Do not relive your days as a smoker.   Day 25: Hold a pencil in your hand when talking on the telephone.   Day 26: Tell all your friends you do not smoke.   Day 27: Think about how much better food tastes.   Day 28: Remember, one cigarette is one too many.   Day 29: Take up a hobby that will keep your hands busy.   Day 30: Congratulations! One month without smoking! Give yourself a big reward.     Your caregiver can direct you to community resources or hospitals for support, which may include:   . Group support.   . Education.   . Hypnosis.   . Subliminal therapy.      Document Released: 12/01/2003 Document Revised: 02/21/2011 Document Reviewed: 12/19/2008   Exit[employer] Patient Information 2012 [employer].

## 2011-10-22 ENCOUNTER — Ambulatory Visit: Payer: Self-pay | Admitting: Family Medicine

## 2011-11-13 ENCOUNTER — Telehealth: Payer: Self-pay | Admitting: Family Medicine

## 2011-11-13 NOTE — Telephone Encounter (Signed)
Patient's mother walk-in request to know if Patient can have refill of Add medicine and pain medicine sent into [facility]. Thanks

## 2011-11-14 ENCOUNTER — Other Ambulatory Visit: Payer: Self-pay | Admitting: *Deleted

## 2011-11-14 DIAGNOSIS — M549 Dorsalgia, unspecified: Secondary | ICD-10-CM

## 2011-11-14 DIAGNOSIS — G8929 Other chronic pain: Secondary | ICD-10-CM

## 2011-11-14 DIAGNOSIS — F988 Other specified behavioral and emotional disorders with onset usually occurring in childhood and adolescence: Secondary | ICD-10-CM

## 2011-11-14 MED ORDER — AMPHETAMINE-DEXTROAMPHETAMINE 20 MG PO TABS: 20.0000 mg | ORAL_TABLET | Freq: Two times a day (BID) | ORAL | Status: DC

## 2011-11-14 MED ORDER — HYDROCODONE-ACETAMINOPHEN 10-325 MG PO TABS: 1.0000 | ORAL_TABLET | Freq: Two times a day (BID) | ORAL | Status: DC

## 2011-11-14 NOTE — Telephone Encounter (Signed)
LM that rx's can be picked up Friday.

## 2011-12-16 ENCOUNTER — Other Ambulatory Visit: Payer: Self-pay | Admitting: *Deleted

## 2011-12-16 DIAGNOSIS — G8929 Other chronic pain: Secondary | ICD-10-CM

## 2011-12-16 DIAGNOSIS — F988 Other specified behavioral and emotional disorders with onset usually occurring in childhood and adolescence: Secondary | ICD-10-CM

## 2011-12-16 MED ORDER — AMPHETAMINE-DEXTROAMPHETAMINE 20 MG PO TABS: 20.0000 mg | ORAL_TABLET | Freq: Two times a day (BID) | ORAL | Status: DC

## 2011-12-16 MED ORDER — HYDROCODONE-ACETAMINOPHEN 10-325 MG PO TABS: 1.0000 | ORAL_TABLET | Freq: Two times a day (BID) | ORAL | Status: DC

## 2012-01-16 ENCOUNTER — Ambulatory Visit (INDEPENDENT_AMBULATORY_CARE_PROVIDER_SITE_OTHER): Payer: PRIVATE HEALTH INSURANCE | Admitting: Family Medicine

## 2012-01-16 ENCOUNTER — Encounter: Payer: Self-pay | Admitting: Family Medicine

## 2012-01-16 VITALS — BP 140/76 | HR 87 | Wt 186.0 lb

## 2012-01-16 DIAGNOSIS — F17200 Nicotine dependence, unspecified, uncomplicated: Secondary | ICD-10-CM

## 2012-01-16 DIAGNOSIS — F909 Attention-deficit hyperactivity disorder, unspecified type: Secondary | ICD-10-CM

## 2012-01-16 DIAGNOSIS — F988 Other specified behavioral and emotional disorders with onset usually occurring in childhood and adolescence: Secondary | ICD-10-CM

## 2012-01-16 DIAGNOSIS — Z23 Encounter for immunization: Secondary | ICD-10-CM

## 2012-01-16 DIAGNOSIS — M549 Dorsalgia, unspecified: Secondary | ICD-10-CM

## 2012-01-16 DIAGNOSIS — G8929 Other chronic pain: Secondary | ICD-10-CM

## 2012-01-16 DIAGNOSIS — Z720 Tobacco use: Secondary | ICD-10-CM

## 2012-01-16 MED ORDER — ORPHENADRINE CITRATE ER 100 MG PO TB12: 100.0000 mg | ORAL_TABLET | Freq: Two times a day (BID) | ORAL | Status: DC

## 2012-01-16 MED ORDER — MELOXICAM 15 MG PO TABS: 15.0000 mg | ORAL_TABLET | Freq: Every day | ORAL | Status: DC

## 2012-01-16 MED ORDER — HYDROCODONE-ACETAMINOPHEN 10-325 MG PO TABS: 1.0000 | ORAL_TABLET | Freq: Two times a day (BID) | ORAL | Status: DC

## 2012-01-16 MED ORDER — AMPHETAMINE-DEXTROAMPHETAMINE 20 MG PO TABS: 20.0000 mg | ORAL_TABLET | Freq: Two times a day (BID) | ORAL | Status: DC

## 2012-01-16 NOTE — Progress Notes (Signed)
  Subjective:    Patient ID: Hector Campbell, male    DOB: 1991/02/27, 21 y.o.   MRN: [id number]  HPI #1 ADD. He reports doing well with the Adderall twice a day. He states that he is able to concentrate better on that dosage and on that medication. The Vyvanse kept him up at night in this way he is able to make sure why he's a sleep he can rest.  #2 back pain. He is on Norco 10-25 he needs refill of that medication but also wants to know if he can be increased to 3 times a day. He is using the Flexeril pa patch but has not on a current muscle relaxer or oral anti-inflammatory.   #3 tobacco abuse/smoking counseling he continues to smoke.  #4 immunization update. Patient needs flu shot. Review of Systems  All other systems reviewed and are negative.   BP 140/76  Pulse 87  Wt 186 lb (84.369 kg)  SpO2 98%    Objective:   Physical Exam  Vitals reviewed. Constitutional: He is oriented to person, place, and time. He appears well-developed.  HENT:  Head: Normocephalic.  Cardiovascular: Normal rate and regular rhythm.   Pulmonary/Chest: Effort normal and breath sounds normal.  Musculoskeletal: Normal range of motion. He exhibits no edema.       Mild tenderness over the spine.  Neurological: He is alert and oriented to person, place, and time.  Skin: Skin is warm and dry.  Psychiatric: He has a normal mood and affect. His behavior is normal.       Assessment & Plan:  #1 ADD renew Adderall 20 mg twice a day. #2 chronic back pain. We will discourage him from getting extra Norco. Instead we'll see how well he does with Mobic 15 mg and Norflex twice a day. #3 tobacco abuse. Try to educate patient on the risks and development of COPD in the future.  #4 immunization update flu shot given today.  Patient informed of provider departure from the practice. Return in 4 months for followup.

## 2012-01-16 NOTE — Patient Instructions (Signed)
Back Pain, Adult Low back pain is very common. About 1 in 5 people have back pain.The cause of low back pain is rarely dangerous. The pain often gets better over time.About half of people with a sudden onset of back pain feel better in just 2 weeks. About 8 in 10 people feel better by 6 weeks.  CAUSES Some common causes of back pain include:  Strain of the muscles or ligaments supporting the spine.  Wear and tear (degeneration) of the spinal discs.  Arthritis.  Direct injury to the back. DIAGNOSIS Most of the time, the direct cause of low back pain is not known.However, back pain can be treated effectively even when the exact cause of the pain is unknown.Answering your caregiver's questions about your overall health and symptoms is one of the most accurate ways to make sure the cause of your pain is not dangerous. If your caregiver needs more information, he or she may order lab work or imaging tests (X-rays or MRIs).However, even if imaging tests show changes in your back, this usually does not require surgery. HOME CARE INSTRUCTIONS For many people, back pain returns.Since low back pain is rarely dangerous, it is often a condition that people can learn to manageon their own.   Remain active. It is stressful on the back to sit or stand in one place. Do not sit, drive, or stand in one place for more than 30 minutes at a time. Take short walks on level surfaces as soon as pain allows.Try to increase the length of time you walk each day.  Do not stay in bed.Resting more than 1 or 2 days can delay your recovery.  Do not avoid exercise or work.Your body is made to move.It is not dangerous to be active, even though your back may hurt.Your back will likely heal faster if you return to being active before your pain is gone.  Pay attention to your body when you bend and lift. Many people have less discomfortwhen lifting if they bend their knees, keep the load close to their bodies,and  avoid twisting. Often, the most comfortable positions are those that put less stress on your recovering back.  Find a comfortable position to sleep. Use a firm mattress and lie on your side with your knees slightly bent. If you lie on your back, put a pillow under your knees.  Only take over-the-counter or prescription medicines as directed by your caregiver. Over-the-counter medicines to reduce pain and inflammation are often the most helpful.Your caregiver may prescribe muscle relaxant drugs.These medicines help dull your pain so you can more quickly return to your normal activities and healthy exercise.  Put ice on the injured area.  Put ice in a plastic bag.  Place a towel between your skin and the bag.  Leave the ice on for 15 to 20 minutes, 3 to 4 times a day for the first 2 to 3 days. After that, ice and heat may be alternated to reduce pain and spasms.  Ask your caregiver about trying back exercises and gentle massage. This may be of some benefit.  Avoid feeling anxious or stressed.Stress increases muscle tension and can worsen back pain.It is important to recognize when you are anxious or stressed and learn ways to manage it.Exercise is a great option. SEEK MEDICAL CARE IF:  You have pain that is not relieved with rest or medicine.  You have pain that does not improve in 1 week.  You have new symptoms.  You are generally   not feeling well. SEEK IMMEDIATE MEDICAL CARE IF:   You have pain that radiates from your back into your legs.  You develop new bowel or bladder control problems.  You have unusual weakness or numbness in your arms or legs.  You develop nausea or vomiting.  You develop abdominal pain.  You feel faint. Document Released: 03/04/2005 Document Revised: 09/03/2011 Document Reviewed: 07/23/2010 Exit[employer] Patient Information 2013 [employer].  

## 2012-02-10 ENCOUNTER — Telehealth: Payer: Self-pay | Admitting: *Deleted

## 2012-02-10 DIAGNOSIS — F988 Other specified behavioral and emotional disorders with onset usually occurring in childhood and adolescence: Secondary | ICD-10-CM

## 2012-02-10 DIAGNOSIS — G8929 Other chronic pain: Secondary | ICD-10-CM

## 2012-02-10 NOTE — Telephone Encounter (Signed)
Request a refill on the vicoden and adderall. Can pick up Wednesday if ok.

## 2012-02-11 MED ORDER — AMPHETAMINE-DEXTROAMPHETAMINE 20 MG PO TABS: 20.0000 mg | ORAL_TABLET | Freq: Two times a day (BID) | ORAL | Status: DC

## 2012-02-11 MED ORDER — HYDROCODONE-ACETAMINOPHEN 10-325 MG PO TABS: 1.0000 | ORAL_TABLET | Freq: Two times a day (BID) | ORAL | Status: DC

## 2012-03-13 ENCOUNTER — Other Ambulatory Visit: Payer: Self-pay | Admitting: *Deleted

## 2012-03-13 DIAGNOSIS — M549 Dorsalgia, unspecified: Secondary | ICD-10-CM

## 2012-03-13 DIAGNOSIS — F988 Other specified behavioral and emotional disorders with onset usually occurring in childhood and adolescence: Secondary | ICD-10-CM

## 2012-03-13 DIAGNOSIS — G8929 Other chronic pain: Secondary | ICD-10-CM

## 2012-03-13 MED ORDER — AMPHETAMINE-DEXTROAMPHETAMINE 20 MG PO TABS: 20.0000 mg | ORAL_TABLET | Freq: Two times a day (BID) | ORAL | Status: DC

## 2012-03-13 MED ORDER — HYDROCODONE-ACETAMINOPHEN 10-325 MG PO TABS: 1.0000 | ORAL_TABLET | Freq: Two times a day (BID) | ORAL | Status: DC

## 2012-04-14 ENCOUNTER — Encounter: Payer: Self-pay | Admitting: Sports Medicine

## 2012-04-14 ENCOUNTER — Ambulatory Visit (INDEPENDENT_AMBULATORY_CARE_PROVIDER_SITE_OTHER): Payer: PRIVATE HEALTH INSURANCE | Admitting: Sports Medicine

## 2012-04-14 VITALS — BP 131/70 | HR 85 | Wt 200.0 lb

## 2012-04-14 DIAGNOSIS — G8929 Other chronic pain: Secondary | ICD-10-CM

## 2012-04-14 DIAGNOSIS — F988 Other specified behavioral and emotional disorders with onset usually occurring in childhood and adolescence: Secondary | ICD-10-CM

## 2012-04-14 DIAGNOSIS — M549 Dorsalgia, unspecified: Secondary | ICD-10-CM

## 2012-04-14 MED ORDER — AMPHETAMINE-DEXTROAMPHETAMINE 30 MG PO TABS: 30.0000 mg | ORAL_TABLET | Freq: Two times a day (BID) | ORAL | Status: DC

## 2012-04-14 MED ORDER — HYDROCODONE-ACETAMINOPHEN 10-325 MG PO TABS: 1.0000 | ORAL_TABLET | Freq: Two times a day (BID) | ORAL | Status: DC

## 2012-04-14 NOTE — Assessment & Plan Note (Signed)
After vertebral compression fracture years ago. We discussed discontinuation of Norco. I will refill today, he understands that this is not going to be a long-term thing. He can continue flector patches and Mobic. From the sounds of it, he has had epidural injections, physical therapy, MRIs at [facility] orth[facility]. I would like him to obtain all of these records. His pain today is predominately with extension which suggests that the facet joint may be the pain generating structure.
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
## 2012-04-14 NOTE — Patient Instructions (Addendum)
Please bring records including: MRI report and images on CD Physical therapy reports if able. Reports of epidural injections so I know exactly which levels were injected. Psychologist eval for ADHD.  We will go over all of this in the next visit.

## 2012-04-14 NOTE — Assessment & Plan Note (Signed)
Refilling Adderall with a 30 mg dose. Sufficient for one month. He understands that he does need to get me the results of his most recent psychologist evaluation.

## 2012-05-13 ENCOUNTER — Encounter: Payer: Self-pay | Admitting: Sports Medicine

## 2012-05-13 DIAGNOSIS — M549 Dorsalgia, unspecified: Secondary | ICD-10-CM

## 2012-05-13 DIAGNOSIS — G8929 Other chronic pain: Secondary | ICD-10-CM

## 2012-05-13 NOTE — Progress Notes (Signed)
Records from [facility] were reviewed. They're summarized under the overview of back pain. I would like to see him back to further discuss back pain and possibly obtain a new MRI for consideration of injections at a different anatomical structure.

## 2012-05-15 ENCOUNTER — Other Ambulatory Visit: Payer: Self-pay | Admitting: *Deleted

## 2012-05-15 ENCOUNTER — Encounter: Payer: Self-pay | Admitting: Sports Medicine

## 2012-05-15 DIAGNOSIS — F988 Other specified behavioral and emotional disorders with onset usually occurring in childhood and adolescence: Secondary | ICD-10-CM

## 2012-05-15 MED ORDER — AMPHETAMINE-DEXTROAMPHETAMINE 30 MG PO TABS: 30.0000 mg | ORAL_TABLET | Freq: Two times a day (BID) | ORAL | Status: DC

## 2012-05-20 ENCOUNTER — Telehealth: Payer: Self-pay | Admitting: *Deleted

## 2012-05-20 MED ORDER — TRAMADOL-ACETAMINOPHEN 37.5-325 MG PO TABS: 1.0000 | ORAL_TABLET | Freq: Four times a day (QID) | ORAL | Status: DC | PRN

## 2012-05-20 NOTE — Telephone Encounter (Signed)
Pt is ok with sending either the Ultracet or tramadol which ever one you choose. Send to [facility]

## 2012-05-20 NOTE — Telephone Encounter (Signed)
Mother states pt will have new insurance starting on the 15th of March and would like to know if you would be willing to call pt in enough pain med to get him through until then. Please advise.

## 2012-05-20 NOTE — Telephone Encounter (Signed)
Pt informed

## 2012-05-20 NOTE — Telephone Encounter (Signed)
No but I am happy to do tramadol, ultracet, and other potent analgesics if he would like.

## 2012-05-20 NOTE — Telephone Encounter (Signed)
Ultracet will likely be a little bit stronger, sending this in now.

## 2012-05-20 NOTE — Telephone Encounter (Signed)
Patient calls and wants to know if you will refill his hydrocodone until he can get into a pain clinic

## 2012-05-20 NOTE — Telephone Encounter (Signed)
Unfortunately the single prescription I gave him last time is all I can do as I do not do narcotic refills here. I did want to see him back to discuss an additional MRI in looking at the facet joints as possible pain generating structures as he has never had these evaluated. I was able to go through all records from Dr. Ethelene Hal, thank you for getting these to me.

## 2012-06-04 ENCOUNTER — Telehealth: Payer: Self-pay | Admitting: *Deleted

## 2012-06-04 NOTE — Telephone Encounter (Signed)
Mom calls and states that son says the Tramadol is not working for his pain. He lost his job at [employer] because he can not stand for 7-8 hours at a time. Request some Hydrocodone until Victorino Dike can get him into a pain clinic because he is a lot of pain.

## 2012-06-04 NOTE — Telephone Encounter (Signed)
No controlled substances for this, it starts with one rx and then becomes chronic and is not the answer for the back pain.

## 2012-06-04 NOTE — Telephone Encounter (Signed)
Pt.notified

## 2012-06-11 ENCOUNTER — Other Ambulatory Visit: Payer: Self-pay | Admitting: Sports Medicine

## 2012-06-11 ENCOUNTER — Telehealth: Payer: Self-pay | Admitting: *Deleted

## 2012-06-11 NOTE — Telephone Encounter (Signed)
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  He would like the letter to request a change of venue as well.  Thanks

## 2012-06-11 NOTE — Telephone Encounter (Signed)
Pt.notified

## 2012-06-11 NOTE — Telephone Encounter (Signed)
Here's what we'll do, I will write a letter documenting the specifics of his back pain, I cannot say that it is too bad for him to work in a particular location but I can add some recommended TEMPORARY restrictions until he follows up again.  Letter is in my outbox.

## 2012-06-15 ENCOUNTER — Other Ambulatory Visit: Payer: Self-pay | Admitting: *Deleted

## 2012-06-15 DIAGNOSIS — F988 Other specified behavioral and emotional disorders with onset usually occurring in childhood and adolescence: Secondary | ICD-10-CM

## 2012-06-15 MED ORDER — AMPHETAMINE-DEXTROAMPHETAMINE 30 MG PO TABS: 30.0000 mg | ORAL_TABLET | Freq: Two times a day (BID) | ORAL | Status: DC

## 2012-06-22 ENCOUNTER — Encounter: Payer: Self-pay | Admitting: *Deleted

## 2012-07-13 ENCOUNTER — Telehealth: Payer: Self-pay | Admitting: *Deleted

## 2012-07-13 DIAGNOSIS — F988 Other specified behavioral and emotional disorders with onset usually occurring in childhood and adolescence: Secondary | ICD-10-CM

## 2012-07-13 MED ORDER — AMPHETAMINE-DEXTROAMPHETAMINE 30 MG PO TABS: 30.0000 mg | ORAL_TABLET | Freq: Two times a day (BID) | ORAL | Status: DC

## 2012-07-13 NOTE — Telephone Encounter (Signed)
Prescription is in my outbox.

## 2012-07-13 NOTE — Telephone Encounter (Signed)
Pt mom calls and states son is requesting a refill on his adderall.

## 2012-08-12 ENCOUNTER — Telehealth: Payer: Self-pay | Admitting: *Deleted

## 2012-08-12 DIAGNOSIS — F988 Other specified behavioral and emotional disorders with onset usually occurring in childhood and adolescence: Secondary | ICD-10-CM

## 2012-08-12 MED ORDER — AMPHETAMINE-DEXTROAMPHETAMINE 30 MG PO TABS: 30.0000 mg | ORAL_TABLET | Freq: Two times a day (BID) | ORAL | Status: DC

## 2012-08-12 NOTE — Telephone Encounter (Signed)
rx in outbox

## 2012-08-12 NOTE — Telephone Encounter (Signed)
Pt notified. Kimberly Gordon, LPN  

## 2012-08-12 NOTE — Telephone Encounter (Signed)
Pt request refill on Adderall. 

## 2012-08-20 ENCOUNTER — Telehealth: Payer: Self-pay | Admitting: *Deleted

## 2012-08-20 NOTE — Telephone Encounter (Signed)
Pt's mom states he is not working now and seems to be in a daze. She also states he is not eating and sleeps a lot and seems to be down. She thinks he needs to talk to someone. She thinks he has seen Dr. Demetrius Charity downstairs in [facility] before. She is asking if we can do a referral for ASAP.

## 2012-08-20 NOTE — Telephone Encounter (Signed)
Dr. Laury Deep is the perfect person for him to see, Misty, please see if we can get him in there sooner rather than later.

## 2012-08-21 NOTE — Telephone Encounter (Signed)
Got pt appt for Tuesday 08/25/12 @ 2pm. Pt needs to arrive in [facility] @ 1:30pm for paperwork. LM on VM for pt to return call in regards of appt.  Meyer Cory, LPN

## 2012-08-25 ENCOUNTER — Ambulatory Visit (HOSPITAL_COMMUNITY): Payer: PRIVATE HEALTH INSURANCE | Admitting: Psychiatry

## 2012-09-14 ENCOUNTER — Ambulatory Visit (INDEPENDENT_AMBULATORY_CARE_PROVIDER_SITE_OTHER): Payer: BC Managed Care – PPO | Admitting: Sports Medicine

## 2012-09-14 ENCOUNTER — Encounter: Payer: Self-pay | Admitting: Sports Medicine

## 2012-09-14 VITALS — BP 133/73 | HR 84 | Wt 200.0 lb

## 2012-09-14 DIAGNOSIS — G8929 Other chronic pain: Secondary | ICD-10-CM

## 2012-09-14 DIAGNOSIS — F988 Other specified behavioral and emotional disorders with onset usually occurring in childhood and adolescence: Secondary | ICD-10-CM

## 2012-09-14 DIAGNOSIS — M549 Dorsalgia, unspecified: Secondary | ICD-10-CM

## 2012-09-14 MED ORDER — LISDEXAMFETAMINE DIMESYLATE 70 MG PO CAPS: 70.0000 mg | ORAL_CAPSULE | ORAL | Status: DC

## 2012-09-14 MED ORDER — AMITRIPTYLINE HCL 50 MG PO TABS: ORAL_TABLET | ORAL | Status: DC

## 2012-09-14 NOTE — Assessment & Plan Note (Signed)
Discontinuing Adderall per patient request. Starting Vyvanse.

## 2012-09-14 NOTE — Assessment & Plan Note (Signed)
Noha has had L4-5 as well as L5-S1 facet injections which were ineffective, as well as L2-L3 epidural which did not improve symptoms. I'm still awaiting his lumbar spine MRI results were previous provider. He does understand we will not be doing any narcotics, and I am going to go ahead and do a referral for a pain clinic. In the meantime I would like to see if there is anything interventional I can use as a target. Amitriptyline at bedtime in the meantime.

## 2012-09-14 NOTE — Progress Notes (Signed)
  Subjective:    CC: Followup  HPI: ADHD: Kreg like to switch to Vyvanse from Adderall. He was stable for a while, but has noted decreasing efficacy of the Adderall, he does not request early refills.  Low back pain: Was going to [facility] orthopedics, has already had L4-L5 as well as L5-S1 facet joint injections, these were ineffective, he also had an L2-L3 epidural steroid injection which did not improve his symptoms. He got me most of his records, but I do not have reports of his MRI of the lumbar spine. He has persistent pain, and would like to get into a pain management clinic. Pain is localized, doesn't radiate, moderate.  Past medical history, Surgical history, Family history not pertinant except as noted below, Social history, Allergies, and medications have been entered into the medical record, reviewed, and no changes needed.   Review of Systems: No fevers, chills, night sweats, weight loss, chest pain, or shortness of breath.   Objective:    General: Well Developed, well nourished, and in no acute distress.  Neuro: Alert and oriented x3, extra-ocular muscles intact, sensation grossly intact.  HEENT: Normocephalic, atraumatic, pupils equal round reactive to light, neck supple, no masses, no lymphadenopathy, thyroid nonpalpable.  Skin: Warm and dry, no rashes. Cardiac: Regular rate and rhythm, no murmurs rubs or gallops, no lower extremity edema.  Respiratory: Clear to auscultation bilaterally. Not using accessory muscles, speaking in full sentences. Back Exam:  Inspection: Unremarkable  Motion: Flexion 45 deg, Extension 45 deg, Side Bending to 45 deg bilaterally,  Rotation to 45 deg bilaterally  SLR laying: Negative  XSLR laying: Negative  Palpable tenderness: None. FABER: negative. Sensory change: Gross sensation intact to all lumbar and sacral dermatomes.  Reflexes: 2+ at both patellar tendons, 2+ at achilles tendons, Babinski's downgoing.  Strength at foot    Plantar-flexion: 5/5 Dorsi-flexion: 5/5 Eversion: 5/5 Inversion: 5/5  Leg strength  Quad: 5/5 Hamstring: 5/5 Hip flexor: 5/5 Hip abductors: 5/5  Gait unremarkable. Impression and Recommendations:

## 2012-09-29 ENCOUNTER — Telehealth: Payer: Self-pay | Admitting: *Deleted

## 2012-09-29 NOTE — Telephone Encounter (Signed)
Mom called & left message stating that the pt is still in a lot of pain & would like you to go ahead & order the MRI of his back

## 2012-09-29 NOTE — Telephone Encounter (Signed)
We discussed this at his last office visit, he has had an MRI, and was going to get me the disc and the report. Let me know if he is unable to get this, and I can go ahead and just order another one.

## 2012-09-29 NOTE — Telephone Encounter (Signed)
Spoke with mom.  She was unaware that you had requested previous mri so she is going to work on that & get it to you.

## 2012-10-05 ENCOUNTER — Telehealth: Payer: Self-pay

## 2012-10-05 DIAGNOSIS — M545 Low back pain, unspecified: Secondary | ICD-10-CM

## 2012-10-05 NOTE — Telephone Encounter (Signed)
Patient pack?  And did we ever get the old MRI images?

## 2012-10-05 NOTE — Telephone Encounter (Signed)
Mom called stated that she wants an order for patient pack. The preferred location would be [facility] [phone]. Obed Samek,CMA

## 2012-10-06 NOTE — Telephone Encounter (Signed)
Order for MRI has been faxed to [facility] [phone]. Gabriellah Rabel,CMA

## 2012-10-06 NOTE — Telephone Encounter (Signed)
Order placed. Return for MRI results followup, they do need to get a CD with the images.

## 2012-10-06 NOTE — Telephone Encounter (Signed)
Typo Dr. Karie Schwalbe pt mom wants an order for MRI for back pain. Preferred location would be [facility]. And no we do not have copies of old MRI results.  Tyrone Pautsch,CMA

## 2012-10-12 ENCOUNTER — Encounter: Payer: Self-pay | Admitting: Sports Medicine

## 2012-10-12 ENCOUNTER — Other Ambulatory Visit: Payer: Self-pay

## 2012-10-12 DIAGNOSIS — F988 Other specified behavioral and emotional disorders with onset usually occurring in childhood and adolescence: Secondary | ICD-10-CM

## 2012-10-12 MED ORDER — LISDEXAMFETAMINE DIMESYLATE 70 MG PO CAPS: 70.0000 mg | ORAL_CAPSULE | ORAL | Status: DC

## 2012-10-13 ENCOUNTER — Ambulatory Visit (INDEPENDENT_AMBULATORY_CARE_PROVIDER_SITE_OTHER): Payer: BC Managed Care – PPO | Admitting: Sports Medicine

## 2012-10-13 ENCOUNTER — Encounter: Payer: Self-pay | Admitting: Sports Medicine

## 2012-10-13 VITALS — BP 145/93 | HR 90 | Wt 193.0 lb

## 2012-10-13 DIAGNOSIS — M549 Dorsalgia, unspecified: Secondary | ICD-10-CM

## 2012-10-13 DIAGNOSIS — G8929 Other chronic pain: Secondary | ICD-10-CM

## 2012-10-13 MED ORDER — KETOROLAC TROMETHAMINE 30 MG/ML IJ SOLN
30.0000 mg | Freq: Once | INTRAMUSCULAR | Status: AC
  Administered 2012-10-13: 30 mg via INTRAMUSCULAR

## 2012-10-13 MED ORDER — METHOCARBAMOL 500 MG PO TABS: 500.0000 mg | ORAL_TABLET | Freq: Three times a day (TID) | ORAL | Status: DC

## 2012-10-13 NOTE — Addendum Note (Signed)
Addended by: Pixie Casino on: 10/13/2012 04:37 PM   Modules accepted: Orders

## 2012-10-13 NOTE — Progress Notes (Signed)
  Subjective:    CC: Followup  HPI: Chronic low back pain: Tullio unfortunately has a pretty sad story, he's 22 years old, and has multilevel degenerative disc disease at L1-L2, L2-L3, L3-L4, and L4-L5, as well as bilateral facet hypertrophy at L4-5 and L5-S1. He has had multiple injections into both the L4-5 and L5 with facet joints, as well as epidurals at the L2-L3 level, none of the above helped his symptoms. He's been on multiple NSAIDs, muscle relaxers, neuropathic medications, and still has had no response. I treated him with various nerve blockers, and more recently amitriptyline, he has not noted any benefit. He does desire narcotic treatment. Her for into a pain management doctor the last visit, he was seen, it was recommended that he have epidurals or surgery, no narcotics were prescribed. He returns today nearly tearful, wondering what else can be done for his pain. It's localized in the low back, without radiation, worse when sitting and flexion, it wakes him up from sleep. No bowel or bladder dysfunction.  Past medical history, Surgical history, Family history not pertinant except as noted below, Social history, Allergies, and medications have been entered into the medical record, reviewed, and no changes needed.   Review of Systems: No fevers, chills, night sweats, weight loss, chest pain, or shortness of breath.   Objective:    General: Well Developed, well nourished, and in no acute distress.  Neuro: Alert and oriented x3, extra-ocular muscles intact, sensation grossly intact.  HEENT: Normocephalic, atraumatic, pupils equal round reactive to light, neck supple, no masses, no lymphadenopathy, thyroid nonpalpable.  Skin: Warm and dry, no rashes. Cardiac: Regular rate and rhythm, no murmurs rubs or gallops, no lower extremity edema.  Respiratory: Clear to auscultation bilaterally. Not using accessory muscles, speaking in full sentences.  Impression and Recommendations:

## 2012-10-13 NOTE — Assessment & Plan Note (Addendum)
Hector Campbell does have a real reason to have pain with multilevel degenerative disc disease as well as facet spondylosis. He has had insufficient response to facet injections as well as epidurals. His most recent pain doctor has offered epidurals and operative intervention which Hector Campbell declined. He's tried a multitude of NSAIDs, muscle relaxers, neurogenic agents, he wants narcotics. Cymbalta was ineffective. Gabapentin was ineffective. Amitriptyline was ineffective. At this point I am willing to try Hector Campbell. This should help him sleep if anything. Hector Campbell 30 intramuscular today. I have been very clear that we'll not be prescribing chronic narcotics. Also going to add a referral to Hector Campbell for pain management.

## 2012-10-15 ENCOUNTER — Ambulatory Visit: Payer: PRIVATE HEALTH INSURANCE | Admitting: Sports Medicine

## 2012-10-20 ENCOUNTER — Telehealth: Payer: Self-pay | Admitting: *Deleted

## 2012-10-20 NOTE — Telephone Encounter (Signed)
Mom left a message wanting to know if you had received patient's MRI results & also if we could send a copy to Dr. Cyndia Diver office at [facility].

## 2012-10-20 NOTE — Telephone Encounter (Signed)
Please tell Hector Campbell that I review his MRI, I did not see anything new that I can used as a target for injections. He needs to come pick up the MRI CD which I will place in my box. He should then deliver this to Dr. Jordan Likes.  I do not have an actual report for the MRI, just the images which I reviewed in detail.

## 2012-10-21 NOTE — Telephone Encounter (Signed)
Patient's mom notified of MRI & was advised to come pick up cd.  She's asking if you received the old MRI from [facility].Marland KitchenMarland KitchenI'm assuming yes since you said you didn't see anything new.

## 2012-10-21 NOTE — Telephone Encounter (Signed)
Yes, the CD they gave me was the OLD mri from [facility].

## 2012-10-21 NOTE — Telephone Encounter (Signed)
So you do not have the disk of the last MRI correct?

## 2012-10-22 NOTE — Telephone Encounter (Signed)
Correct

## 2012-10-26 ENCOUNTER — Encounter: Payer: Self-pay | Admitting: Sports Medicine

## 2012-11-11 ENCOUNTER — Telehealth: Payer: Self-pay | Admitting: *Deleted

## 2012-11-11 DIAGNOSIS — F988 Other specified behavioral and emotional disorders with onset usually occurring in childhood and adolescence: Secondary | ICD-10-CM

## 2012-11-11 NOTE — Telephone Encounter (Signed)
Pt calls and wants to know if you will refill his Vyvanse

## 2012-11-12 MED ORDER — LISDEXAMFETAMINE DIMESYLATE 70 MG PO CAPS: 70.0000 mg | ORAL_CAPSULE | ORAL | Status: DC

## 2012-11-12 NOTE — Telephone Encounter (Signed)
Pt notified ready for pick up. Barry Dienes, LPN

## 2012-11-12 NOTE — Telephone Encounter (Signed)
Prescription is in my outbox.

## 2012-12-14 ENCOUNTER — Other Ambulatory Visit: Payer: Self-pay | Admitting: *Deleted

## 2012-12-14 DIAGNOSIS — F988 Other specified behavioral and emotional disorders with onset usually occurring in childhood and adolescence: Secondary | ICD-10-CM

## 2012-12-14 MED ORDER — LISDEXAMFETAMINE DIMESYLATE 70 MG PO CAPS: 70.0000 mg | ORAL_CAPSULE | ORAL | Status: DC

## 2012-12-31 ENCOUNTER — Encounter: Payer: Self-pay | Admitting: Sports Medicine

## 2012-12-31 ENCOUNTER — Ambulatory Visit (INDEPENDENT_AMBULATORY_CARE_PROVIDER_SITE_OTHER): Payer: BC Managed Care – PPO | Admitting: Sports Medicine

## 2012-12-31 ENCOUNTER — Ambulatory Visit (INDEPENDENT_AMBULATORY_CARE_PROVIDER_SITE_OTHER): Payer: BC Managed Care – PPO

## 2012-12-31 VITALS — BP 146/87 | HR 83 | Temp 100.0°F | Wt 190.0 lb

## 2012-12-31 DIAGNOSIS — R042 Hemoptysis: Secondary | ICD-10-CM | POA: Insufficient documentation

## 2012-12-31 DIAGNOSIS — R05 Cough: Secondary | ICD-10-CM

## 2012-12-31 DIAGNOSIS — R062 Wheezing: Secondary | ICD-10-CM

## 2012-12-31 DIAGNOSIS — R059 Cough, unspecified: Secondary | ICD-10-CM

## 2012-12-31 DIAGNOSIS — R918 Other nonspecific abnormal finding of lung field: Secondary | ICD-10-CM

## 2012-12-31 LAB — POCT INFLUENZA A/B
Influenza A, POC: NEGATIVE
Influenza B, POC: NEGATIVE

## 2012-12-31 MED ORDER — HYDROCOD POLST-CHLORPHEN POLST 10-8 MG/5ML PO LQCR: 5.0000 mL | Freq: Two times a day (BID) | ORAL | Status: DC | PRN

## 2012-12-31 MED ORDER — FLUTICASONE PROPIONATE 50 MCG/ACT NA SUSP: NASAL | Status: DC

## 2012-12-31 MED ORDER — PREDNISONE 50 MG PO TABS: 50.0000 mg | ORAL_TABLET | Freq: Every day | ORAL | Status: DC

## 2012-12-31 MED ORDER — AZITHROMYCIN 250 MG PO TABS: ORAL_TABLET | ORAL | Status: DC

## 2012-12-31 NOTE — Progress Notes (Signed)
  Subjective:    CC: Cough  HPI: This is a 22 year old male smoker who presents with a productive cough that began one week ago and is worsening. He also describes pressure in his sinuses and a greenish nasal discharge. He sometimes coughs up a similar green material. He reports widespread muscle aches. He has had periodic chills and fevers up to 101 at home. He has had very little appetite and has not been eating regular meals. He normally smokes about one pack per day of cigarettes but says he has not smoked since the cough began. He denies any chest pain or shortness of breath. He has been treating his symptoms with acetaminophen and mucinex. He did not get an influenza vaccine this year.  Past medical history, Surgical history, Family history not pertinant except as noted below, Social history, Allergies, and medications have been entered into the medical record, reviewed, and no changes needed.   Review of Systems: Reports fevers and chills. No night sweats, chest pain, or shortness of breath.   Objective:    General: Well Developed, well nourished, and in no acute distress.  Neuro: Alert and oriented x3, extra-ocular muscles intact, sensation grossly intact.  HEENT: Normocephalic, atraumatic, pupils equal round reactive to light, neck supple, no masses, no cervical lymphadenopathy, thyroid nonpalpable.  Skin: Warm and dry, no rashes. Cardiac: Regular rate and rhythm, no murmurs rubs or gallops, no lower extremity edema.  Respiratory: Coarse breath sounds, inspiratory and expiratory wheezes. Not using accessory muscles, speaking in full sentences.   Impression and Recommendations:   Assessment: This is a 22 year old with sinusitis and possible community-acquired pneumonia.  Plan: 1. Chest X-ray today to rule out pneumonia 2. Tussionex as needed for cough 3. Azithromycin 4. Flonase twice daily 5. Prednisone  This note was originally written by Karin Lieu MS3

## 2012-12-31 NOTE — Assessment & Plan Note (Signed)
There certainly is some sinusitis, he does have some coarse sounds as well as inspiratory and expiratory wheezes in his right lower lobe. Chest x-ray, azithromycin, prednisone, Flonase. Return if no better in one to 2 weeks.

## 2013-01-11 ENCOUNTER — Telehealth: Payer: Self-pay

## 2013-01-11 DIAGNOSIS — F988 Other specified behavioral and emotional disorders with onset usually occurring in childhood and adolescence: Secondary | ICD-10-CM

## 2013-01-11 MED ORDER — LISDEXAMFETAMINE DIMESYLATE 70 MG PO CAPS: 70.0000 mg | ORAL_CAPSULE | ORAL | Status: DC

## 2013-01-11 NOTE — Telephone Encounter (Signed)
Refill for Vyvanse is in my box, no refills for tussinex, he should not still be coughing and if he is I would need to see him again to come up with another plan rather than cover it up with a narcotic.

## 2013-01-11 NOTE — Telephone Encounter (Signed)
Patient called requesting a refill for Vyanse and a refill of Tussionex. Rhonda Cunningham,CMA

## 2013-01-12 ENCOUNTER — Other Ambulatory Visit: Payer: Self-pay

## 2013-01-12 DIAGNOSIS — F988 Other specified behavioral and emotional disorders with onset usually occurring in childhood and adolescence: Secondary | ICD-10-CM

## 2013-01-12 MED ORDER — LISDEXAMFETAMINE DIMESYLATE 70 MG PO CAPS: 70.0000 mg | ORAL_CAPSULE | ORAL | Status: DC

## 2013-01-12 NOTE — Telephone Encounter (Signed)
Called patient left detailed message on patient vm with instructions as directed below. Brigett Estell,CMA

## 2013-02-09 ENCOUNTER — Telehealth: Payer: Self-pay

## 2013-02-09 DIAGNOSIS — F988 Other specified behavioral and emotional disorders with onset usually occurring in childhood and adolescence: Secondary | ICD-10-CM

## 2013-02-09 MED ORDER — LISDEXAMFETAMINE DIMESYLATE 70 MG PO CAPS: 70.0000 mg | ORAL_CAPSULE | ORAL | Status: DC

## 2013-02-09 NOTE — Telephone Encounter (Signed)
Rx is in my box 

## 2013-02-09 NOTE — Telephone Encounter (Signed)
Patient  Mother called and request refill for Vyanse. Jalisa Sacco,CMA

## 2013-02-22 ENCOUNTER — Telehealth: Payer: Self-pay | Admitting: *Deleted

## 2013-02-22 ENCOUNTER — Ambulatory Visit (INDEPENDENT_AMBULATORY_CARE_PROVIDER_SITE_OTHER): Payer: BC Managed Care – PPO

## 2013-02-22 ENCOUNTER — Encounter: Payer: Self-pay | Admitting: Sports Medicine

## 2013-02-22 ENCOUNTER — Ambulatory Visit (INDEPENDENT_AMBULATORY_CARE_PROVIDER_SITE_OTHER): Payer: BC Managed Care – PPO | Admitting: Sports Medicine

## 2013-02-22 VITALS — BP 143/84 | HR 119 | Wt 192.0 lb

## 2013-02-22 DIAGNOSIS — R042 Hemoptysis: Secondary | ICD-10-CM

## 2013-02-22 DIAGNOSIS — M549 Dorsalgia, unspecified: Secondary | ICD-10-CM

## 2013-02-22 DIAGNOSIS — G8929 Other chronic pain: Secondary | ICD-10-CM

## 2013-02-22 DIAGNOSIS — F988 Other specified behavioral and emotional disorders with onset usually occurring in childhood and adolescence: Secondary | ICD-10-CM

## 2013-02-22 DIAGNOSIS — IMO0001 Reserved for inherently not codable concepts without codable children: Secondary | ICD-10-CM

## 2013-02-22 MED ORDER — KETOROLAC TROMETHAMINE 30 MG/ML IJ SOLN
30.0000 mg | Freq: Once | INTRAMUSCULAR | Status: AC
  Administered 2013-02-22: 30 mg via INTRAMUSCULAR

## 2013-02-22 NOTE — Telephone Encounter (Signed)
No PA required for CT Chest w/o.  Meyer Cory, LPN'

## 2013-02-22 NOTE — Assessment & Plan Note (Signed)
With a negative chest x-ray a few months ago, persistent status post treatment with antibiotics. At this point I am going to obtain a CT scan of his chest.

## 2013-02-22 NOTE — Assessment & Plan Note (Addendum)
Trigger point injection x3 today. Toradol 30 mg intramuscular. Was unable to see Dr. Oneal Grout for pain management. We are going to try a different pain doctor.

## 2013-02-22 NOTE — Progress Notes (Signed)
  Subjective:    CC: Follow up  HPI: ADHD: Desires to switch to regular Adderall. He understands and he will not be up for a refill until 2 weeks from now.  Back pain: was unable to get in with Dr. Oneal Grout for pain management, he does have persistent back pain and is eager to get a trigger point injection today.  Hemoptysis: Present for over a month now, he did have a history of pneumonia, he was treated with antibiotics but unfortunately symptoms have not improved. He denies any weight loss, constitutional symptoms, or GI symptoms.  Past medical history, Surgical history, Family history not pertinant except as noted below, Social history, Allergies, and medications have been entered into the medical record, reviewed, and no changes needed.   Review of Systems: No fevers, chills, night sweats, weight loss, chest pain, or shortness of breath.   Objective:    General: Well Developed, well nourished, and in no acute distress.  Neuro: Alert and oriented x3, extra-ocular muscles intact, sensation grossly intact.  HEENT: Normocephalic, atraumatic, pupils equal round reactive to light, neck supple, no masses, no lymphadenopathy, thyroid nonpalpable.  Skin: Warm and dry, no rashes. Cardiac: Regular rate and rhythm, no murmurs rubs or gallops, no lower extremity edema.  Respiratory: Clear to auscultation bilaterally. Not using accessory muscles, speaking in full sentences. Back Exam:  Inspection: Unremarkable  Motion: Flexion 45 deg, Extension 45 deg, Side Bending to 45 deg bilaterally,  Rotation to 45 deg bilaterally  SLR laying: Negative  XSLR laying: Negative  Palpable tenderness: Tender to palpation on the right paralumbar muscles.Marland Kitchen FABER: negative. Sensory change: Gross sensation intact to all lumbar and sacral dermatomes.  Reflexes: 2+ at both patellar tendons, 2+ at achilles tendons, Babinski's downgoing.  Strength at foot  Plantar-flexion: 5/5 Dorsi-flexion: 5/5 Eversion: 5/5  Inversion: 5/5  Leg strength  Quad: 5/5 Hamstring: 5/5 Hip flexor: 5/5 Hip abductors: 5/5  Gait unremarkable.  Procedure:  Injection of 3 painful trigger points along the right paralumbar muscles. Consent obtained and verified. Time-out conducted. Noted no overlying erythema, induration, or other signs of local infection. Skin prepped in a sterile fashion. Topical analgesic spray: Ethyl chloride. Completed without difficulty. Meds: A total of 1 cc Kenalog 40, 2 cc lidocaine, 2 cc Marcaine spread between 3 trigger points. Pain immediately improved suggesting accurate placement of the medication. Advised to call if fevers/chills, erythema, induration, drainage, or persistent bleeding.  Impression and Recommendations:

## 2013-02-22 NOTE — Assessment & Plan Note (Signed)
Discontinuing Vyvanse, switching to Adderall 30 mg twice a day, he will request this again in about 2 weeks when he will be due for a refill.

## 2013-03-08 ENCOUNTER — Telehealth: Payer: Self-pay

## 2013-03-08 NOTE — Telephone Encounter (Signed)
Patient request refill for Vyvanse. Mother stated that Rx could be post dated due to patient being out of town till the first of the year. Kayal Mula,CMA

## 2013-03-09 ENCOUNTER — Other Ambulatory Visit: Payer: Self-pay | Admitting: *Deleted

## 2013-03-09 ENCOUNTER — Other Ambulatory Visit: Payer: Self-pay | Admitting: Sports Medicine

## 2013-03-09 DIAGNOSIS — F988 Other specified behavioral and emotional disorders with onset usually occurring in childhood and adolescence: Secondary | ICD-10-CM

## 2013-03-09 MED ORDER — LISDEXAMFETAMINE DIMESYLATE 70 MG PO CAPS: 70.0000 mg | ORAL_CAPSULE | ORAL | Status: DC

## 2013-03-09 MED ORDER — AMPHETAMINE-DEXTROAMPHETAMINE 30 MG PO TABS: 30.0000 mg | ORAL_TABLET | Freq: Two times a day (BID) | ORAL | Status: DC

## 2013-03-09 NOTE — Addendum Note (Signed)
Addended by: Monica Becton on: 03/09/2013 04:45 PM   Modules accepted: Orders, Medications

## 2013-03-09 NOTE — Telephone Encounter (Signed)
Rx in box. Discontinue Vyvanse.

## 2013-03-12 ENCOUNTER — Encounter: Payer: Self-pay | Admitting: Sports Medicine

## 2013-03-12 ENCOUNTER — Ambulatory Visit (INDEPENDENT_AMBULATORY_CARE_PROVIDER_SITE_OTHER): Payer: BC Managed Care – PPO | Admitting: Sports Medicine

## 2013-03-12 VITALS — BP 137/95 | HR 109 | Wt 189.0 lb

## 2013-03-12 DIAGNOSIS — M549 Dorsalgia, unspecified: Secondary | ICD-10-CM

## 2013-03-12 DIAGNOSIS — G8929 Other chronic pain: Secondary | ICD-10-CM

## 2013-03-12 DIAGNOSIS — F988 Other specified behavioral and emotional disorders with onset usually occurring in childhood and adolescence: Secondary | ICD-10-CM

## 2013-03-12 MED ORDER — HYDROXYZINE HCL 50 MG PO TABS: 50.0000 mg | ORAL_TABLET | Freq: Three times a day (TID) | ORAL | Status: AC | PRN

## 2013-03-12 NOTE — Assessment & Plan Note (Addendum)
Moving to [place] for work, he just got a month prescription for Adderall. No further refills, he needs to find another doctor. At this point he is having significant anxiety, I am going to add hydroxyzine. He was tearful in the room but did not express any suicidal or homicidal ideation.

## 2013-03-12 NOTE — Progress Notes (Signed)
  Subjective:    CC: Med check  HPI: Charlies comes back to see me, he has ADHD, and chronic back pain. We recently gave him a prescription for a one-month supply of Adderall, we have also sent him to pain management as we are not prescribing him chronic narcotics. He comes t to the office to tell me he is moving to [place], and has asked for a prescription of hydrocodone. He is extremely nervous, he tells me he is moving to a new job. Otherwise his ADD is under control.  Past medical history, Surgical history, Family history not pertinant except as noted below, Social history, Allergies, and medications have been entered into the medical record, reviewed, and no changes needed.   Review of Systems: No fevers, chills, night sweats, weight loss, chest pain, or shortness of breath.   Objective:    General: Well Developed, well nourished, and in no acute distress.  Neuro: Alert and oriented x3, extra-ocular muscles intact, sensation grossly intact.  HEENT: Normocephalic, atraumatic, pupils equal round reactive to light, neck supple, no masses, no lymphadenopathy, thyroid nonpalpable. No suicidal or homicidal ideation. Skin: Warm and dry, no rashes. Cardiac: Regular rate and rhythm, no murmurs rubs or gallops, no lower extremity edema.  Respiratory: Clear to auscultation bilaterally. Not using accessory muscles, speaking in full sentences.  Impression and Recommendations:

## 2013-03-12 NOTE — Assessment & Plan Note (Signed)
Hector Campbell Will be moving to [place] for work. I have asked him to go downstairs to get reports and CDs of his images. He did ask for a refill on narcotics which was declined. I wished him luck in the future.

## 2013-05-14 ENCOUNTER — Telehealth: Payer: Self-pay | Admitting: *Deleted

## 2013-05-14 MED ORDER — AMPHETAMINE-DEXTROAMPHETAMINE 30 MG PO TABS: 30.0000 mg | ORAL_TABLET | Freq: Two times a day (BID) | ORAL | Status: DC

## 2013-05-14 NOTE — Telephone Encounter (Signed)
[place] must not have worked out.  Rx in box, needs to come pick it up.

## 2013-05-14 NOTE — Telephone Encounter (Signed)
Pt husband notified rx ready for pick up. Hector DienesKimberly Claudetta Sallie, LPN

## 2013-05-14 NOTE — Telephone Encounter (Signed)
Pt mother calls and wants a refill on his Adderall. Note says he moved to [place]

## 2013-06-11 ENCOUNTER — Other Ambulatory Visit: Payer: Self-pay | Admitting: *Deleted

## 2013-06-11 MED ORDER — AMPHETAMINE-DEXTROAMPHETAMINE 30 MG PO TABS: 30.0000 mg | ORAL_TABLET | Freq: Two times a day (BID) | ORAL | Status: DC

## 2013-06-11 NOTE — Telephone Encounter (Signed)
Call when ready [phone]

## 2013-07-13 ENCOUNTER — Other Ambulatory Visit: Payer: Self-pay

## 2013-07-13 ENCOUNTER — Other Ambulatory Visit: Payer: Self-pay | Admitting: *Deleted

## 2013-07-13 MED ORDER — AMPHETAMINE-DEXTROAMPHETAMINE 30 MG PO TABS: 30.0000 mg | ORAL_TABLET | Freq: Two times a day (BID) | ORAL | Status: DC

## 2013-08-11 ENCOUNTER — Telehealth: Payer: Self-pay

## 2013-08-11 MED ORDER — AMPHETAMINE-DEXTROAMPHETAMINE 30 MG PO TABS
30.0000 mg | ORAL_TABLET | Freq: Two times a day (BID) | ORAL | Status: DC
Start: 2013-08-11 — End: 2013-09-10

## 2013-08-11 NOTE — Telephone Encounter (Signed)
Patient request refill for Adderall. Gregery Walberg,CMA   

## 2013-08-11 NOTE — Telephone Encounter (Signed)
Rx in box. 

## 2013-08-12 NOTE — Telephone Encounter (Signed)
Patient was informed that RX Adderall was ready for pickup at the office. Patient informed me that this was his last time getting it filled because he was moving to [place]. Rhonda Cunningham,CMA

## 2013-09-09 ENCOUNTER — Telehealth: Payer: Self-pay | Admitting: *Deleted

## 2013-09-09 NOTE — Telephone Encounter (Signed)
Pt is requesting an Adderral refill.   To call mom when ready [phone] Dianne. KG CMA

## 2013-09-10 MED ORDER — AMPHETAMINE-DEXTROAMPHETAMINE 30 MG PO TABS: 30.0000 mg | ORAL_TABLET | Freq: Two times a day (BID) | ORAL | Status: DC

## 2013-09-10 NOTE — Telephone Encounter (Signed)
rx in box 

## 2013-10-13 ENCOUNTER — Telehealth: Payer: Self-pay

## 2013-10-13 MED ORDER — AMPHETAMINE-DEXTROAMPHETAMINE 30 MG PO TABS: 30.0000 mg | ORAL_TABLET | Freq: Two times a day (BID) | ORAL | Status: DC

## 2013-10-13 NOTE — Telephone Encounter (Signed)
Patient was advised that follow up appt will be needed for further refills. Rhonda Cunningham,CMA

## 2014-06-21 IMAGING — CT CT CHEST W/O CM
4 series · 18 of 36 positions shown, 20 images · non-contrast
Comparison: 12/31/2012

CLINICAL DATA: Hemoptysis for months.  Tobacco use.

EXAM:
CT CHEST WITHOUT CONTRAST
TECHNIQUE: Multidetector CT imaging of the chest was performed following the
standard protocol without IV contrast.

[Series 2: chest w/o · axial · non-contrast · 0.70mm/px · z∈[-277,-87]mm · 5 of 64 slices shown, 7 images]
[im 13/64  mediastinal]
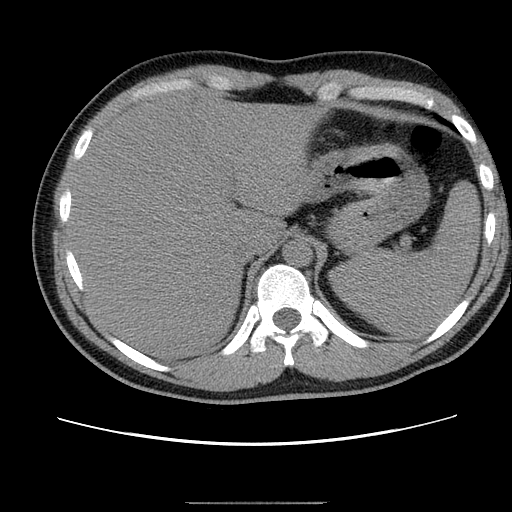
[im 13/64  lung]
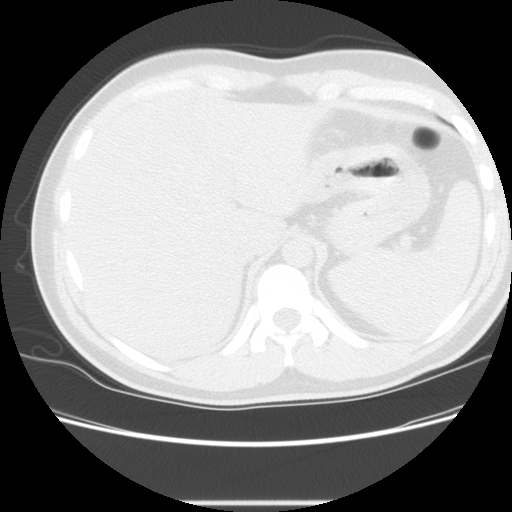
[im 26/64  lung]
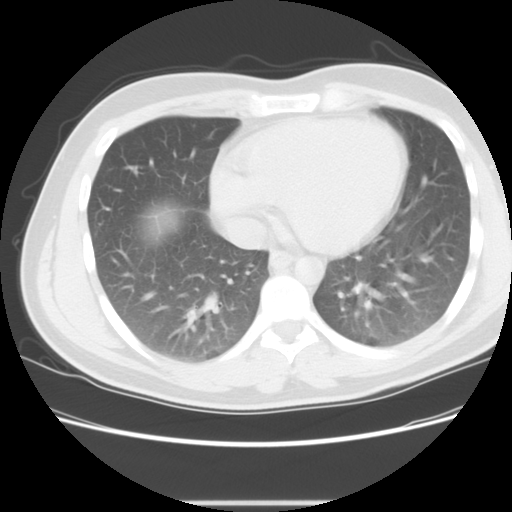
[im 33/64  lung]
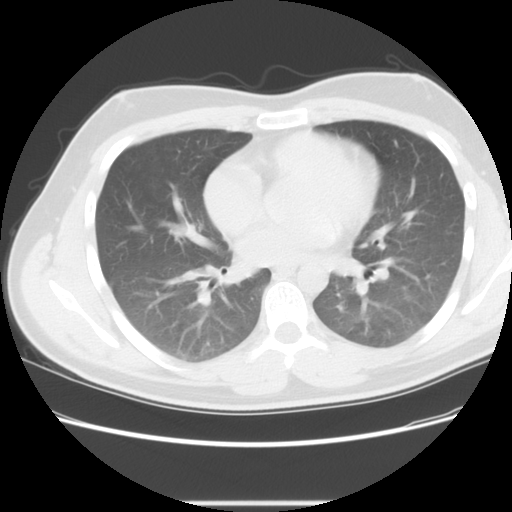
[im 38/64  lung]
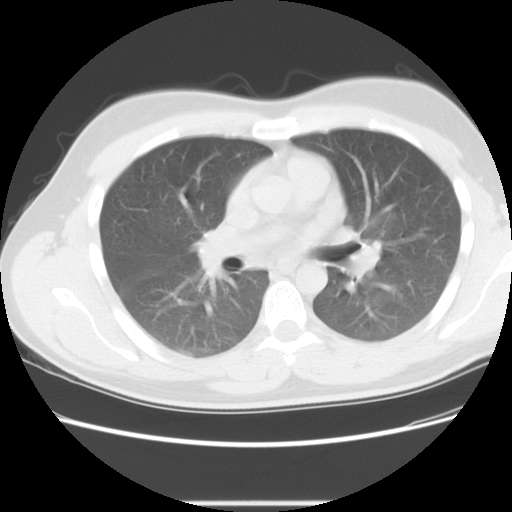
[im 51/64  mediastinal]
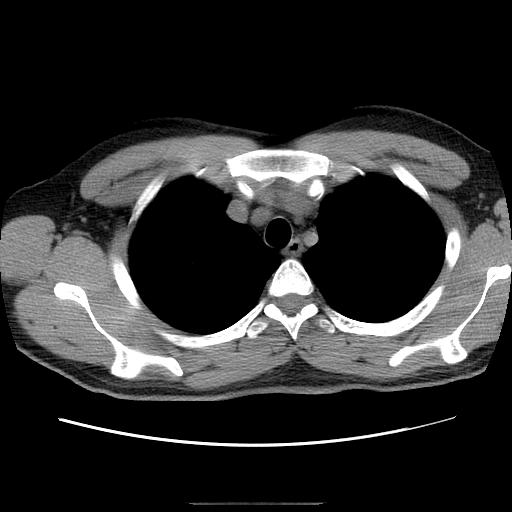
[im 51/64  lung]
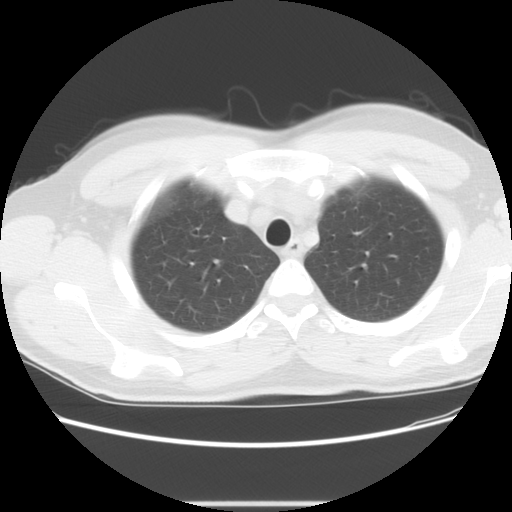

[Series 3: lung windows · axial · 0.70mm/px · z∈[-274,-89]mm · 5 of 63 slices shown]
[im 13/63  lung]
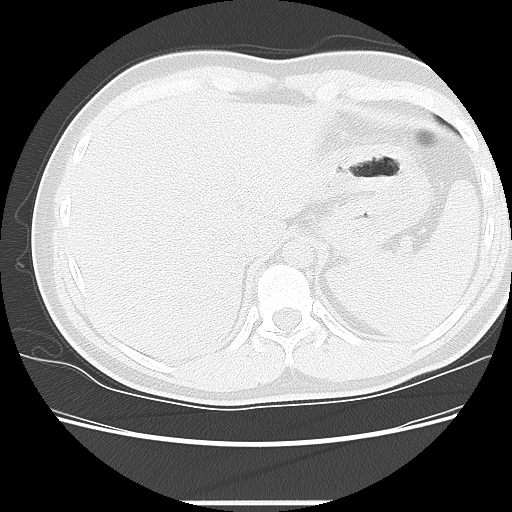
[im 25/63  lung]
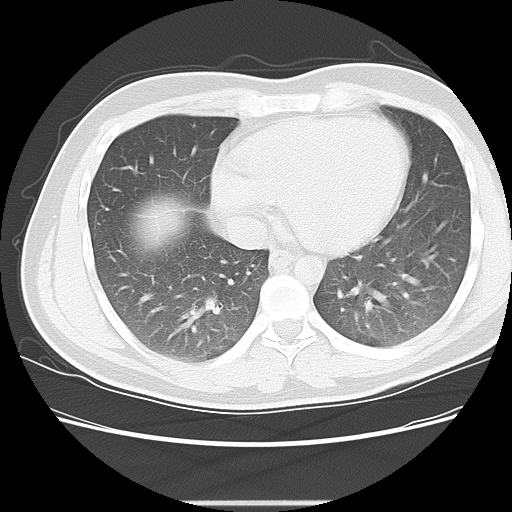
[im 33/63  lung]
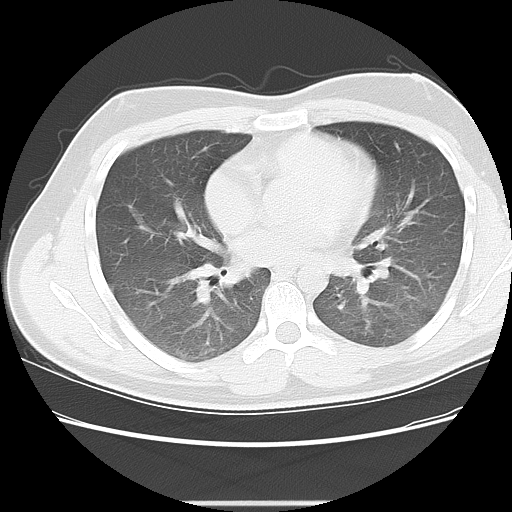
[im 38/63  lung]
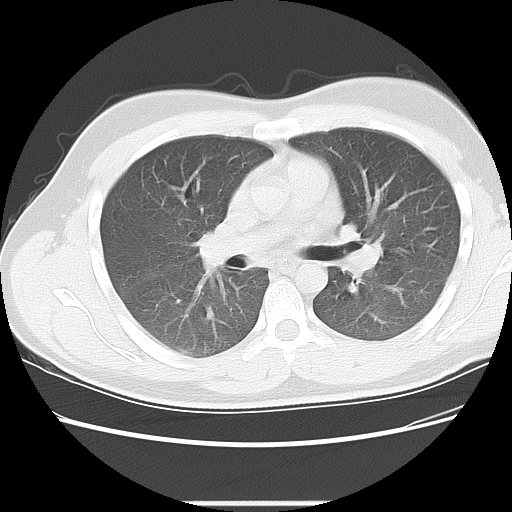
[im 50/63  lung]
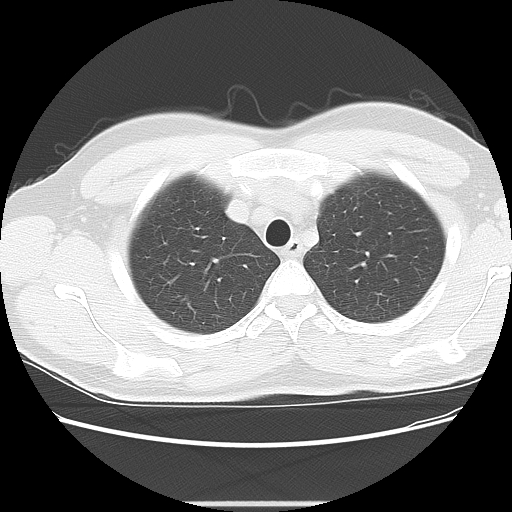

[Series 200: coronal · coronal · 0.70mm/px · 3 of 102 slices shown]
[im 21/102  lung]
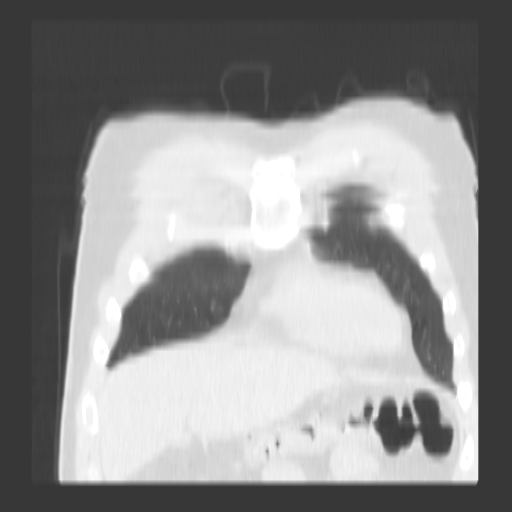
[im 41/102  lung]
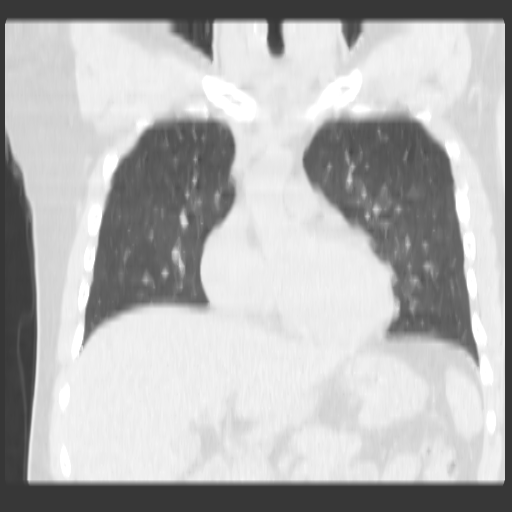
[im 61/102  lung]
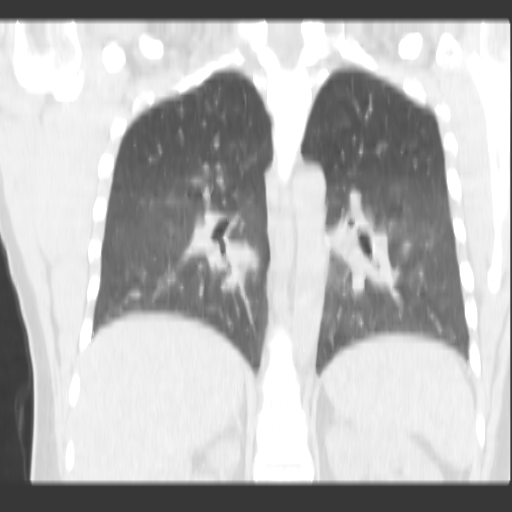

[Series 201: sagital · sagittal · 0.70mm/px · 5 of 178 slices shown]
[im 12/178  lung]
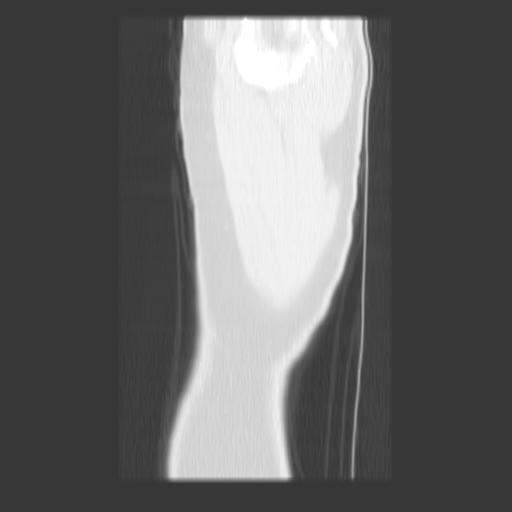
[im 36/178  lung]
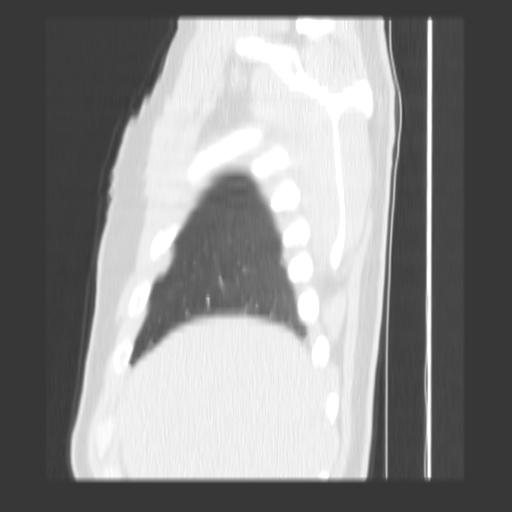
[im 60/178  lung]
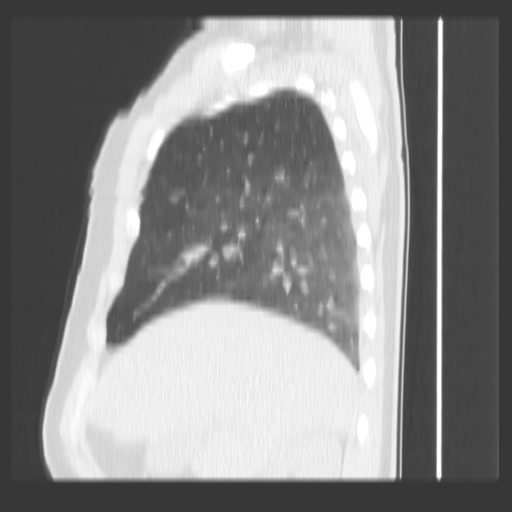
[im 83/178  lung]
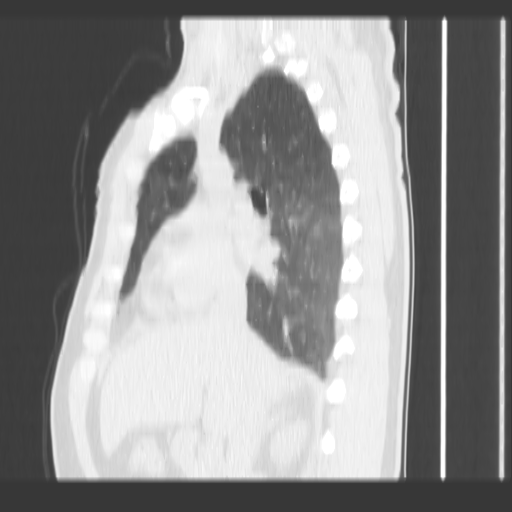
[im 95/178  lung]
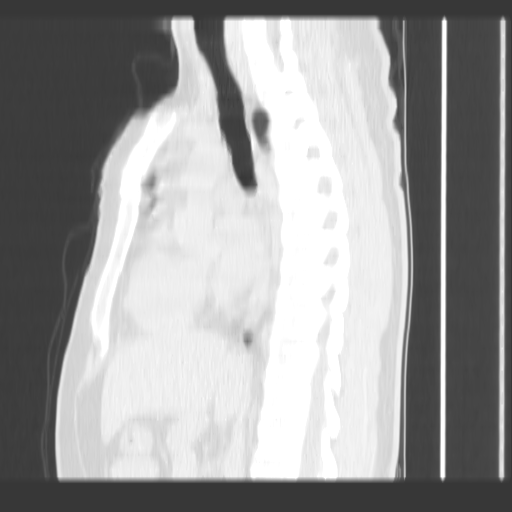

[18 of 36 positions shown; findings below may reference images not displayed]

FINDINGS: No pathologic mediastinal adenopathy observed.

There is considerable breathing motion artifact. The lungs appear
clear. A cause for hemoptysis is not identified in the chest.
IMPRESSION: 1.  No significant abnormality identified.

## 2014-11-23 ENCOUNTER — Encounter: Payer: Self-pay | Admitting: Sports Medicine

## 2014-11-23 ENCOUNTER — Ambulatory Visit (INDEPENDENT_AMBULATORY_CARE_PROVIDER_SITE_OTHER): Payer: BLUE CROSS/BLUE SHIELD | Admitting: Sports Medicine

## 2014-11-23 VITALS — BP 135/80 | HR 81 | Wt 183.0 lb

## 2014-11-23 DIAGNOSIS — Z23 Encounter for immunization: Secondary | ICD-10-CM | POA: Diagnosis not present

## 2014-11-23 DIAGNOSIS — F909 Attention-deficit hyperactivity disorder, unspecified type: Secondary | ICD-10-CM

## 2014-11-23 DIAGNOSIS — Z0000 Encounter for general adult medical examination without abnormal findings: Secondary | ICD-10-CM | POA: Insufficient documentation

## 2014-11-23 DIAGNOSIS — F988 Other specified behavioral and emotional disorders with onset usually occurring in childhood and adolescence: Secondary | ICD-10-CM

## 2014-11-23 MED ORDER — AMPHETAMINE-DEXTROAMPHETAMINE 30 MG PO TABS: 30.0000 mg | ORAL_TABLET | Freq: Two times a day (BID) | ORAL | Status: DC

## 2014-11-23 NOTE — Progress Notes (Signed)
  Subjective:    CC: follow-up  HPI: I have not seen Fayrene Fearing in some time, he has ADHD, he was doing well on 30 mg of Adderall twice a day. He has returned to [place] and desires to reestablish care.  Preventative measures: Reasonable to go ahead and pull the trigger for some routine blood work.  Past medical history, Surgical history, Family history not pertinant except as noted below, Social history, Allergies, and medications have been entered into the medical record, reviewed, and no changes needed.   Review of Systems: No fevers, chills, night sweats, weight loss, chest pain, or shortness of breath.   Objective:    General: Well Developed, well nourished, and in no acute distress.  Neuro: Alert and oriented x3, extra-ocular muscles intact, sensation grossly intact.  HEENT: Normocephalic, atraumatic, pupils equal round reactive to light, neck supple, no masses, no lymphadenopathy, thyroid nonpalpable.  Skin: Warm and dry, no rashes. Cardiac: Regular rate and rhythm, no murmurs rubs or gallops, no lower extremity edema.  Respiratory: Clear to auscultation bilaterally. Not using accessory muscles, speaking in full sentences.  Impression and Recommendations:

## 2014-11-23 NOTE — Assessment & Plan Note (Signed)
Checking routine blood work. Influenza vaccination given today.

## 2014-11-23 NOTE — Assessment & Plan Note (Signed)
Hector Campbell does have multiple diagnoses including obsessive-compulsive disorder, dysthymic disorder, and attention deficit hyperactivity disorder. We are going to restart his Adderall, he is going back to school to study chemistry. I would like to see him back in about 3 months. Adderall 30

## 2014-12-20 ENCOUNTER — Telehealth: Payer: Self-pay

## 2014-12-20 DIAGNOSIS — F988 Other specified behavioral and emotional disorders with onset usually occurring in childhood and adolescence: Secondary | ICD-10-CM

## 2014-12-20 NOTE — Telephone Encounter (Signed)
PATIENT REQUEST REFILL FOR ADDERALL. LAST FILLED 11/23/14 Hector Campbell

## 2014-12-21 MED ORDER — AMPHETAMINE-DEXTROAMPHETAMINE 30 MG PO TABS: 30.0000 mg | ORAL_TABLET | Freq: Two times a day (BID) | ORAL | Status: DC

## 2014-12-21 NOTE — Telephone Encounter (Signed)
LEFT DETAILED MESSAGE ON PATIENT VM ADVISING THAT ADDERALL IS READY FOR PICKUP. Rhonda Cunningham,CMA

## 2014-12-21 NOTE — Telephone Encounter (Signed)
Rx in box. 

## 2015-01-18 ENCOUNTER — Other Ambulatory Visit: Payer: Self-pay

## 2015-01-18 ENCOUNTER — Telehealth: Payer: Self-pay

## 2015-01-18 DIAGNOSIS — F988 Other specified behavioral and emotional disorders with onset usually occurring in childhood and adolescence: Secondary | ICD-10-CM

## 2015-01-18 MED ORDER — AMPHETAMINE-DEXTROAMPHETAMINE 30 MG PO TABS: 30.0000 mg | ORAL_TABLET | Freq: Two times a day (BID) | ORAL | Status: AC

## 2015-01-18 NOTE — Telephone Encounter (Signed)
Patient mother called stated that patient will be moving back to [place] next month and she would like another refill on the Adderall  Before patient final move. .please advise. Yogi Arther,CMA

## 2015-01-19 NOTE — Telephone Encounter (Signed)
Patient advised and Rx is up front waiting on patient to pick up. Rhonda Cunningham,CMA

## 2015-01-19 NOTE — Telephone Encounter (Signed)
Medication refilled, he simply needs to pick it up.
